# Patient Record
Sex: Male | Born: 1970 | Race: Black or African American | Hispanic: No | Marital: Married | Smoking: Current some day smoker
Health system: Southern US, Community
[De-identification: ages and names within clinical notes are randomized; demographics above are authoritative.]

## PROBLEM LIST (undated history)

## (undated) DIAGNOSIS — G4733 Obstructive sleep apnea (adult) (pediatric): Secondary | ICD-10-CM

## (undated) DIAGNOSIS — E119 Type 2 diabetes mellitus without complications: Secondary | ICD-10-CM

## (undated) DIAGNOSIS — Z720 Tobacco use: Secondary | ICD-10-CM

## (undated) DIAGNOSIS — E785 Hyperlipidemia, unspecified: Secondary | ICD-10-CM

## (undated) DIAGNOSIS — I2510 Atherosclerotic heart disease of native coronary artery without angina pectoris: Secondary | ICD-10-CM

## (undated) DIAGNOSIS — I10 Essential (primary) hypertension: Secondary | ICD-10-CM

## (undated) HISTORY — PX: ANKLE SURGERY: SHX546

## (undated) HISTORY — DX: Atherosclerotic heart disease of native coronary artery without angina pectoris: I25.10

## (undated) HISTORY — DX: Hyperlipidemia, unspecified: E78.5

## (undated) HISTORY — DX: Tobacco use: Z72.0

## (undated) HISTORY — DX: Obstructive sleep apnea (adult) (pediatric): G47.33

---

## 2003-08-01 ENCOUNTER — Inpatient Hospital Stay (HOSPITAL_COMMUNITY): Admission: EM | Admit: 2003-08-01 | Discharge: 2003-08-02 | Payer: Self-pay | Admitting: Emergency Medicine

## 2004-03-10 ENCOUNTER — Emergency Department (HOSPITAL_COMMUNITY): Admission: EM | Admit: 2004-03-10 | Discharge: 2004-03-10 | Payer: Self-pay | Admitting: Emergency Medicine

## 2004-11-17 ENCOUNTER — Emergency Department (HOSPITAL_COMMUNITY): Admission: EM | Admit: 2004-11-17 | Discharge: 2004-11-17 | Payer: Self-pay | Admitting: *Deleted

## 2004-11-18 ENCOUNTER — Emergency Department (HOSPITAL_COMMUNITY): Admission: EM | Admit: 2004-11-18 | Discharge: 2004-11-18 | Payer: Self-pay | Admitting: Emergency Medicine

## 2006-02-12 ENCOUNTER — Emergency Department (HOSPITAL_COMMUNITY): Admission: EM | Admit: 2006-02-12 | Discharge: 2006-02-12 | Payer: Self-pay | Admitting: Family Medicine

## 2006-02-20 ENCOUNTER — Ambulatory Visit (HOSPITAL_BASED_OUTPATIENT_CLINIC_OR_DEPARTMENT_OTHER): Admission: RE | Admit: 2006-02-20 | Discharge: 2006-02-20 | Payer: Self-pay | Admitting: Orthopedic Surgery

## 2006-09-03 ENCOUNTER — Emergency Department (HOSPITAL_COMMUNITY): Admission: EM | Admit: 2006-09-03 | Discharge: 2006-09-03 | Payer: Self-pay | Admitting: Emergency Medicine

## 2010-02-25 ENCOUNTER — Emergency Department (HOSPITAL_COMMUNITY)
Admission: EM | Admit: 2010-02-25 | Discharge: 2010-02-25 | Disposition: A | Discharge status: Home / Self Care | Payer: Self-pay | Attending: Emergency Medicine | Admitting: Emergency Medicine

## 2010-05-27 NOTE — Op Note (Signed)
Jorge Green, Jorge Green              ACCOUNT NO.:  [id number]   MEDICAL RECORD NO.:  [id number]          PATIENT TYPE:  AMB   LOCATION:  DSC                          FACILITY:  [facility]   PHYSICIAN:  Leonides Grills, M.D.     DATE OF BIRTH:  1970/04/12   DATE OF PROCEDURE:  02/20/2006  DATE OF DISCHARGE:                               OPERATIVE REPORT   PREOPERATIVE DIAGNOSIS:  Left lateral malleolus fracture, closed.   POSTOPERATIVE DIAGNOSIS:  Left lateral malleolus fracture, closed.   OPERATION:  1. Open reduction internal fixation, left lateral malleolus fracture.  2. Stress x-rays left ankle.   ANESTHESIA:  General.   SURGEON:  Leonides Grills, MD.   ASSISTANT:  Evlyn Kanner, P.A.   ESTIMATED BLOOD LOSS:  Minimal.   TOURNIQUET:  None.   COMPLICATIONS:  None.   DISPOSITION:  Stable to PR.   INDICATION:  A 40 year old male who sustained the above injury.  He was  consented to the above procedure.  All risks which include infection,  nerve or vessel injury, nonunion, malunion, hardware irrigation,  hardware failure, stiffness, arthritis, possibility of intra-articular  pathology may require ankle arthroscopy with debridement were all  explained.  Questions were encouraged and answered.   OPERATION:  The patient brought to the operating room, placed in supine  position.  After adequate general endotracheal tube anesthesia was  administered as well as Ancef 1 g IV piggyback, left lower lobe left  lower extremity is prepped and draped in a sterile manner over  proximally thigh tourniquet.  With the left lower extremity prepped and  draped in a sterile manner, tourniquet was used.  A longitudinal  incision over lateral aspect of the lateral malleolus then made.  Dissection was carried down directly to bone and fracture site. Fracture  site was cleaned up of hematoma and periosteum within the fracture site.  A 4-hole one-third tubular plate was applied in the antiglide position  on the posterolateral corner of the lateral malleolus.  Two-point  reduction clamp was then used to anatomically reduce the fracture and  then two 3.5 mm fully threaded cortical set screws using 2.5-mm drill  hole respectively were placed in the proximal two holes and a lag screw  through the third hole using 3.5, 2.5 mm drill holes respectively.  This  had excellent purchase and maintenance of the correction.  Final stress  x-rays were obtained AP lateral mortis views and showed no gross motion  across the fracture site, fixation in proper position.  Anatomical alignment as well.  No widening of syndesmosis with external  rotation stress as well.  The area was copiously be normal saline.  Subcu was closed with 2-0 Vicryl.  Skin was closed with 4-0 nylon.  Sterile dressing applied.  Modified Jones dressing was applied with the  ankle in neutral dorsiflexion.  Patient stable to PR.      Leonides Grills, M.D.  Electronically Signed     PB/MEDQ  D:  02/20/2006  T:  02/20/2006  Job:  [id number]

## 2010-10-21 LAB — GC/CHLAMYDIA PROBE AMP, GENITAL: GC Probe Amp, Genital: NEGATIVE

## 2011-01-31 ENCOUNTER — Ambulatory Visit (INDEPENDENT_AMBULATORY_CARE_PROVIDER_SITE_OTHER): Payer: PRIVATE HEALTH INSURANCE

## 2011-01-31 DIAGNOSIS — Z0000 Encounter for general adult medical examination without abnormal findings: Secondary | ICD-10-CM

## 2011-01-31 DIAGNOSIS — I10 Essential (primary) hypertension: Secondary | ICD-10-CM

## 2011-01-31 DIAGNOSIS — Z79899 Other long term (current) drug therapy: Secondary | ICD-10-CM

## 2011-01-31 DIAGNOSIS — F17200 Nicotine dependence, unspecified, uncomplicated: Secondary | ICD-10-CM

## 2011-04-29 ENCOUNTER — Ambulatory Visit (INDEPENDENT_AMBULATORY_CARE_PROVIDER_SITE_OTHER): Payer: PRIVATE HEALTH INSURANCE | Admitting: Emergency Medicine

## 2011-04-29 VITALS — BP 135/88 | HR 80 | Temp 98.3°F | Resp 16 | Ht 71.0 in | Wt 235.8 lb

## 2011-04-29 DIAGNOSIS — L02821 Furuncle of head [any part, except face]: Secondary | ICD-10-CM

## 2011-04-29 DIAGNOSIS — L02828 Furuncle of other sites: Secondary | ICD-10-CM

## 2011-04-29 MED ORDER — DOXYCYCLINE HYCLATE 100 MG PO TABS: 100.0000 mg | ORAL_TABLET | Freq: Two times a day (BID) | ORAL | Status: AC

## 2011-04-29 MED ORDER — MUPIROCIN 2 % EX OINT: TOPICAL_OINTMENT | Freq: Three times a day (TID) | CUTANEOUS | Status: AC

## 2011-04-29 NOTE — Progress Notes (Signed)
  Subjective:    Patient ID: Jorge Green, male    DOB: [birth year], 41 y.o.   MRN: [id number]  HPI patient enters with a 3 to four-day history of pain and swelling left temporal area. The area was very swollen last night he tried to match out some fluid.    Review of Systems history of hypertension on medication.     Objective:   Physical Exam physical exam reveals a pustule present over the left temple. There is an approximately 3 cm area of surrounding swelling. The area was compressed and a small amount of purulent material was expressed.        Assessment & Plan:   Assessment boil left temporal suspect MRSA we'll treat with Bactroban and doxycycline and was taken to

## 2011-04-29 NOTE — Patient Instructions (Signed)
MRSA Overview MRSA stands for methicillin-resistant Staphylococcus aureus. It is a type of bacteria that is resistant to some common antibiotics. It can cause infections in the skin and many other places in the body. Staphylococcus aureus, often called "staph," is a bacteria that normally lives on the skin or in the nose. Staph on the surface of the skin or in the nose does not cause problems. However, if the staph enters the body through a cut, wound, or break in the skin, an infection can happen. Up until recently, infections with the MRSA type of staph mainly occurred in hospitals and other healthcare settings. There are now increasing problems with MRSA infections in the community as well. Infections with MRSA may be very serious or even life-threatening. Most MRSA infections are acquired in one of two ways:  Healthcare-associated MRSA (HA-MRSA)   This can be acquired by people in any healthcare setting. MRSA can be a big problem for hospitalized people, people in nursing homes, people in rehabilitation facilities, people with weakened immune systems, dialysis patients, and those who have had surgery.   Community-associated MRSA (CA-MRSA)   Community spread of MRSA is becoming more common. It is known to spread in crowded settings, in jails and prisons, and in situations where there is close skin-to-skin contact, such as during sporting events or in locker rooms. MRSA can be spread through shared items, such as children's toys, razors, towels, or sports equipment.  CAUSES  All staph, including MRSA, are normally harmless unless they enter the body through a scratch, cut, or wound, such as with surgery. All staph, including MRSA, can be spread from person-to-person by touching contaminated objects or through direct contact. SPECIAL GROUPS MRSA can present problems for special groups of people. Some of these groups include:  Breastfeeding women.   The most common problem is MRSA infection of the  breast (mastitis). There is evidence that MRSA can be passed to an infant from infected breast milk. Your caregiver may recommend that you stop breastfeeding until the mastitis is under control.   If you are breastfeeding and have a MRSA infection in a place other than the breast, you may usually continue breastfeeding while under treatment. If taking antibiotics, ask your caregiver if it is safe to continue breastfeeding while taking your prescribed medicines.   Neonates (babies from birth to 1 month old) and infants (babies from 1 month to 1 year old).   There is evidence that MRSA can be passed to a newborn at birth if the mother has MRSA on the skin, in or around the birth canal, or an infection in the uterus, cervix, or vagina. MRSA infection can have the same appearance as a normal newborn or infant rash or several other skin infections. This can make it hard to diagnose MRSA.   Immune compromised people.   If you have an immune system problem, you may have a higher chance of developing a MRSA infection.   People after any type of surgery.   Staph in general, including MRSA, is the most common cause of infections occurring at the site of recent surgery.   People on long-term steroid medicines.   These kinds of medicines can lower your resistance to infection. This can increase your chance of getting MRSA.   People who have had frequent hospitalizations, live in nursing homes or other residential care facilities, have venous or urinary catheters, or have taken multiple courses of antibiotic therapy for any reason.  DIAGNOSIS  Diagnosis of MRSA is   done by cultures of fluid samples that may come from:  Swabs taken from cuts or wounds in infected areas.   Nasal swabs.   Saliva or deep cough specimens from the lungs (sputum).   Urine.   Blood.  Many people are "colonized" with MRSA but have no signs of infection. This means that people carry the MRSA germ on their skin or in their  nose and may never develop MRSA infection.  TREATMENT  Treatment varies and is based on how serious, how deep, or how extensive the infection is. For example:  Some skin infections, such as a small boil or abscess, may be treated by draining yellowish-white fluid (pus) from the site of the infection.   Deeper or more widespread soft tissue infections are usually treated with surgery to drain pus and with antibiotic medicine given by vein or by mouth. This may be recommended even if you are pregnant.   Serious infections may require a hospital stay.  If antibiotics are given, they may be needed for several weeks. PREVENTION  Because many people are colonized with staph, including MRSA, preventing the spread of the bacteria from person-to-person is most important. The best way to prevent the spread of bacteria and other germs is through proper hand washing or by using alcohol-based hand disinfectants. The following are other ways to help prevent MRSA infection within the hospital and community settings.   Healthcare settings:   Strict hand washing or hand disinfection procedures need to be followed before and after touching every patient.   Patients infected with MRSA are placed in isolation to prevent the spread of the bacteria.   Healthcare workers need to wear disposable gowns and gloves when touching or caring for patients infected with MRSA. Visitors may also be asked to wear a gown and gloves.   Hospital surfaces need to be disinfected frequently.   Community settings:   Wash your hands frequently with soap and water for at least 15 seconds. Otherwise, use alcohol-based hand disinfectants when soap and water is not available.   Make sure people who live with you wash their hands often, too.   Do not share personal items. For example, avoid sharing razors and other personal hygiene items, towels, clothing, and athletic equipment.   Wash and dry your clothes and bedding at the  warmest temperatures recommended on the labels.   Keep wounds covered. Pus from infected sores may contain MRSA and other bacteria. Keep cuts and abrasions clean and covered with germ-free (sterile), dry bandages until they are healed.   If you have a wound that appears infected, ask your caregiver if a culture for MRSA and other bacteria should be done.   If you are breastfeeding, talk to your caregiver about MRSA. You may be asked to temporarily stop breastfeeding.  HOME CARE INSTRUCTIONS   Take your antibiotics as directed. Finish them even if you start to feel better.   Avoid close contact with those around you as much as possible. Do not use towels, razors, toothbrushes, bedding, or other items that will be used by others.   To fight the infection, follow your caregiver's instructions for wound care. Wash your hands before and after changing your bandages.   If you have an intravascular device, such as a catheter, make sure you know how to care for it.   Be sure to tell any healthcare providers that you have MRSA so they are aware of your infection.  SEEK IMMEDIATE MEDICAL CARE IF:     The infection appears to be getting worse. Signs include:   Increased warmth, redness, or tenderness around the wound site.   A red line that extends from the infection site.   A dark color in the area around the infection.   Wound drainage that is tan, yellow, or green.   A bad smell coming from the wound.   You feel sick to your stomach (nauseous) and throw up (vomit) or cannot keep medicine down.   You have a fever.   Your baby is older than 3 months with a rectal temperature of 102 F (38.9 C) or higher.   Your baby is 3 months old or younger with a rectal temperature of 100.4 F (38 C) or higher.   You have difficulty breathing.  MAKE SURE YOU:   Understand these instructions.   Will watch your condition.   Will get help right away if you are not doing well or get worse.    Document Released: 12/26/2004 Document Revised: 12/15/2010 Document Reviewed: 03/30/2010 [employer] Patient Information 2012 [employer]. 

## 2011-04-30 ENCOUNTER — Telehealth: Payer: Self-pay

## 2011-04-30 NOTE — Telephone Encounter (Signed)
Patient saw Dr. Cleta Alberts yesterday and was told he has MRSA on his face. Swelling has not gone down any and pt is expected to go back to work tomorrow. Pt thinks he may need an additional one or two days to allow swelling to go down. Please advise.

## 2011-04-30 NOTE — Telephone Encounter (Signed)
Spoke with pt advised to RTC pt is on his way

## 2011-04-30 NOTE — Telephone Encounter (Signed)
Generally, as long as the area is stable or improving, and can be covered, we do not need to excuse patients from work with MRSA infections.  If he is not experiencing any improvement, or if his symptoms are worsening, he should RTC for re-evaluation.

## 2011-05-02 LAB — WOUND CULTURE: Gram Stain: NONE SEEN

## 2012-03-05 ENCOUNTER — Other Ambulatory Visit: Payer: Self-pay | Admitting: Internal Medicine

## 2012-03-05 NOTE — Telephone Encounter (Signed)
Only acute visits listed in epic, no labs, needs OV

## 2017-04-29 ENCOUNTER — Emergency Department (HOSPITAL_COMMUNITY)
Admission: EM | Admit: 2017-04-29 | Discharge: 2017-04-30 | Disposition: A | Discharge status: Home / Self Care | Payer: BLUE CROSS/BLUE SHIELD | Attending: Emergency Medicine | Admitting: Emergency Medicine

## 2017-04-29 ENCOUNTER — Encounter (HOSPITAL_COMMUNITY): Payer: Self-pay

## 2017-04-29 ENCOUNTER — Other Ambulatory Visit: Payer: Self-pay

## 2017-04-29 ENCOUNTER — Emergency Department (HOSPITAL_COMMUNITY): Payer: BLUE CROSS/BLUE SHIELD

## 2017-04-29 DIAGNOSIS — Z79899 Other long term (current) drug therapy: Secondary | ICD-10-CM | POA: Insufficient documentation

## 2017-04-29 DIAGNOSIS — Z7982 Long term (current) use of aspirin: Secondary | ICD-10-CM | POA: Insufficient documentation

## 2017-04-29 DIAGNOSIS — F17200 Nicotine dependence, unspecified, uncomplicated: Secondary | ICD-10-CM | POA: Diagnosis not present

## 2017-04-29 DIAGNOSIS — I10 Essential (primary) hypertension: Secondary | ICD-10-CM | POA: Diagnosis not present

## 2017-04-29 DIAGNOSIS — R072 Precordial pain: Secondary | ICD-10-CM

## 2017-04-29 DIAGNOSIS — R079 Chest pain, unspecified: Secondary | ICD-10-CM

## 2017-04-29 LAB — BASIC METABOLIC PANEL
ANION GAP: 10 (ref 5–15)
BUN: 11 mg/dL (ref 6–20)
CO2: 26 mmol/L (ref 22–32)
Calcium: 9.2 mg/dL (ref 8.9–10.3)
Chloride: 100 mmol/L — ABNORMAL LOW (ref 101–111)
Creatinine, Ser: 0.94 mg/dL (ref 0.61–1.24)
GFR calc Af Amer: >60 mL/min (ref >60)
GFR calc non Af Amer: >60 mL/min (ref >60)
GLUCOSE: 326 mg/dL — AB (ref 65–99)
POTASSIUM: 3.7 mmol/L (ref 3.5–5.1)
SODIUM: 136 mmol/L (ref 135–145)

## 2017-04-29 LAB — CBC
HEMATOCRIT: 44.2 % (ref 39.0–52.0)
HEMOGLOBIN: 15.3 g/dL (ref 13.0–17.0)
MCH: 28.5 pg (ref 26.0–34.0)
MCHC: 34.6 g/dL (ref 30.0–36.0)
MCV: 82.3 fL (ref 78.0–100.0)
Platelets: 264 10*3/uL (ref 150–400)
RBC: 5.37 MIL/uL (ref 4.22–5.81)
RDW: 13.4 % (ref 11.5–15.5)
WBC: 7 10*3/uL (ref 4.0–10.5)

## 2017-04-29 LAB — I-STAT TROPONIN, ED: Troponin i, poc: 0.03 ng/mL (ref 0.00–0.08)

## 2017-04-29 MED ORDER — GI COCKTAIL ~~LOC~~
30.0000 mL | Freq: Once | ORAL | Status: AC
  Administered 2017-04-30: 30 mL via ORAL
  Filled 2017-04-29: qty 30

## 2017-04-29 MED ORDER — NITROGLYCERIN 2 % TD OINT
1.0000 [in_us] | TOPICAL_OINTMENT | Freq: Four times a day (QID) | TRANSDERMAL | Status: DC
  Administered 2017-04-30: 1 [in_us] via TOPICAL
  Filled 2017-04-29: qty 1

## 2017-04-29 MED ORDER — ASPIRIN 81 MG PO CHEW
324.0000 mg | CHEWABLE_TABLET | Freq: Once | ORAL | Status: AC
  Administered 2017-04-30: 324 mg via ORAL
  Filled 2017-04-29: qty 4

## 2017-04-29 NOTE — ED Triage Notes (Signed)
Pt reports L sided chest pain and arm pain starting yesterday, but worsening this evening. Denies nausea or vomiting. Pt is hypertensive with a hx of same. A&Ox4.

## 2017-04-29 NOTE — ED Provider Notes (Signed)
[facility]-EMERGENCY DEPT Provider Note   CSN: [id number] Arrival date & time: 04/29/17  2006     History   Chief Complaint Chief Complaint  Patient presents with  . Chest Pain    HPI Jorge Green is a 47 y.o. male.  HPI Patient presents to the emergency room for evaluation chest pain.  Patient started having symptoms yesterday.  He experienced some mild discomfort in his chest but did not think too much of it.  Today he started having more pressure in his chest.  It was associated with acid reflux type symptoms as well.  Symptoms lasted for several hours throughout the afternoon.  He did radiate to his left arm.  He denies any nausea or vomiting.  Patient does have history of hypertension.  He also smokes.  He denies any trouble with shortness of breath.  No diaphoresis.  He does not have any history of heart disease.  No known history of lung disease.  No family history of heart disease. History reviewed. No pertinent past medical history.  There are no active problems to display for this patient.   History reviewed. No pertinent surgical history.      Home Medications    Prior to Admission medications   Medication Sig Start Date End Date Taking? Authorizing Provider  amLODipine (NORVASC) 10 MG tablet Take 10 mg by mouth daily. 04/12/17  Yes [provider]  aspirin EC 81 MG tablet Take 81 mg by mouth daily.   Yes [provider]  lisinopril-hydrochlorothiazide (PRINZIDE,ZESTORETIC) 20-12.5 MG per tablet Take 1 tablet by mouth daily. NEED VISIT/LABS! 03/05/12  Yes Elsie Stain E, PA-C  Multiple Vitamin (MULTIVITAMIN WITH MINERALS) TABS tablet Take 1 tablet by mouth daily.   Yes [provider]  Potassium 99 MG TABS Take 1 tablet by mouth daily.   Yes [provider]    Family History History reviewed. No pertinent family history.  Social History Social History   Tobacco Use  . Smoking status: Current Every  Day Smoker  Substance Use Topics  . Alcohol use: Not on file  . Drug use: Not on file     Allergies   Patient has no known allergies.   Review of Systems Review of Systems  All other systems reviewed and are negative.    Physical Exam Updated Vital Signs BP (!) 170/113 (BP Location: Left Arm)   Pulse 73   Temp 98.5 F (36.9 C) (Oral)   Resp 14   SpO2 98%   Physical Exam  Constitutional: He appears well-developed and well-nourished. No distress.  HENT:  Head: Normocephalic and atraumatic.  Right Ear: External ear normal.  Left Ear: External ear normal.  Eyes: Conjunctivae are normal. Right eye exhibits no discharge. Left eye exhibits no discharge. No scleral icterus.  Neck: Neck supple. No tracheal deviation present.  Cardiovascular: Normal rate, regular rhythm and intact distal pulses.  Pulmonary/Chest: Effort normal and breath sounds normal. No stridor. No respiratory distress. He has no wheezes. He has no rales.  Abdominal: Soft. Bowel sounds are normal. He exhibits no distension. There is no tenderness. There is no rebound and no guarding.  Musculoskeletal: He exhibits no edema or tenderness.  Neurological: He is alert. He has normal strength. No cranial nerve deficit (no facial droop, extraocular movements intact, no slurred speech) or sensory deficit. He exhibits normal muscle tone. He displays no seizure activity. Coordination normal.  Skin: Skin is warm and dry. No rash noted.  Psychiatric:  He has a normal mood and affect.  Nursing note and vitals reviewed.    ED Treatments / Results  Labs (all labs ordered are listed, but only abnormal results are displayed) Labs Reviewed  BASIC METABOLIC PANEL - Abnormal; Notable for the following components:      Result Value   Chloride 100 (*)    Glucose, Bld 326 (*)    All other components within normal limits  CBC  I-STAT TROPONIN, ED  I-STAT TROPONIN, ED    EKG EKG Interpretation  Date/Time:  Sunday April 29 2017 20:14:13 EDT Ventricular Rate:  97 PR Interval:    QRS Duration: 91 QT Interval:  350 QTC Calculation: 445 R Axis:   -5 Text Interpretation:  Sinus rhythm Probable left atrial enlargement RSR' in V1 or V2, probably normal variant Borderline T wave abnormalities No old tracing to compare Confirmed by Linwood DibblesKnapp, Rihana Kiddy [phone]([id number]) on 04/29/2017 10:18:15 PM   Radiology Dg Chest 2 View  Result Date: 04/29/2017 CLINICAL DATA:  Left-sided chest pain EXAM: CHEST - 2 VIEW COMPARISON:  None. FINDINGS: The heart size and mediastinal contours are within normal limits. Both lungs are clear. The visualized skeletal structures are unremarkable. IMPRESSION: No active cardiopulmonary disease. Electronically Signed   By: Jasmine PangKim  Fujinaga M.D.   On: 04/29/2017 20:36    Procedures Procedures (including critical care time)  Medications Ordered in ED Medications  nitroGLYCERIN (NITROGLYN) 2 % ointment 1 inch (1 inch Topical Given 04/30/17 0004)  aspirin chewable tablet 324 mg (324 mg Oral Given 04/30/17 0004)  gi cocktail (Maalox,Lidocaine,Donnatal) (30 mLs Oral Given 04/30/17 0004)     Initial Impression / Assessment and Plan / ED Course  I have reviewed the triage vital signs and the nursing notes.  Pertinent labs & imaging results that were available during my care of the patient were reviewed by me and considered in my medical decision making (see chart for details).   Patient presented to the emergency room for evaluation of chest pain.  Patient does not have a history of heart disease.  He does have risk factors including hypertension and tobacco use.  Patient's heart score is 3.  Overall low risk for major adverse cardiac event.  Initial troponin and laboratory tests are reassuring.  No acute ischemic changes on EKG.  Plan on a delta troponin.  As long as that is normal I think the patient can safely follow-up with his primary care doctor for outpatient stress testing.  Dr Patria Maneampos will check on the 2nd  troponin  Final Clinical Impressions(s) / ED Diagnoses   Final diagnoses:  Chest pain, unspecified type    ED Discharge Orders    None       Linwood DibblesKnapp, Lunabella Badgett, MD 04/30/17 0009

## 2017-04-30 LAB — I-STAT TROPONIN, ED: Troponin i, poc: 0.06 ng/mL (ref 0.00–0.08)

## 2017-04-30 MED ORDER — OMEPRAZOLE 20 MG PO CPDR: 20.0000 mg | DELAYED_RELEASE_CAPSULE | Freq: Every day | ORAL | 0 refills | Status: DC

## 2017-04-30 MED ORDER — FAMOTIDINE 20 MG PO TABS
20.0000 mg | ORAL_TABLET | Freq: Once | ORAL | Status: AC
  Administered 2017-04-30: 20 mg via ORAL
  Filled 2017-04-30: qty 1

## 2017-04-30 MED ORDER — LISINOPRIL 20 MG PO TABS
20.0000 mg | ORAL_TABLET | Freq: Every day | ORAL | Status: DC
  Administered 2017-04-30: 20 mg via ORAL
  Filled 2017-04-30: qty 1

## 2017-04-30 MED ORDER — LISINOPRIL-HYDROCHLOROTHIAZIDE 20-12.5 MG PO TABS: 1.0000 | ORAL_TABLET | Freq: Every day | ORAL | Status: DC

## 2017-04-30 MED ORDER — HYDROCHLOROTHIAZIDE 12.5 MG PO CAPS
12.5000 mg | ORAL_CAPSULE | Freq: Every day | ORAL | Status: DC
  Administered 2017-04-30: 12.5 mg via ORAL
  Filled 2017-04-30: qty 1

## 2017-04-30 MED ORDER — ALUM & MAG HYDROXIDE-SIMETH 200-200-20 MG/5ML PO SUSP
30.0000 mL | Freq: Once | ORAL | Status: AC
  Administered 2017-04-30: 30 mL via ORAL
  Filled 2017-04-30: qty 30

## 2017-04-30 NOTE — ED Notes (Signed)
Pt denies N/V or SOB

## 2017-04-30 NOTE — ED Provider Notes (Signed)
   EKG Interpretation #3  Date/Time:  Monday April 30 2017 00:39:17 EDT Ventricular Rate:  83 PR Interval:    QRS Duration: 98 QT Interval:  350 QTC Calculation: 412 R Axis:   15 Text Interpretation:  Sinus rhythm RSR' in V1 or V2, probably normal variant Borderline T abnormalities, lateral leads No significant change was found Confirmed by Azalia Bilisampos, Jaaziah Schulke [phone]([id number]) on 04/30/2017 1:44:20 AM      Patient has some transient recurrence of left-sided chest discomfort but this was relieved by taking off the nitroglycerin.  He states he felt much better once the nitro paste was removed from his left chest.  I spoke with the patient in much of his symptoms sound like gastroesophageal reflux disease.  He has had poor dietary choices over the past several days.  Low suspicion for ACS in the setting of several EKGs that are unchanged without ischemic changes and 2- troponins in the emergency department.  Outpatient primary care and cardiology follow-up.  Patient understands return to the ER for new or worsening symptoms.  He will be started on Prilosec daily for 2 weeks.  I recommended dietary changes as well  I personally reviewed the patient's chest x-ray which demonstrates no acute abnormality.  Troponin negative x2.  Hemoglobin stable at 15.3.  Hyperglycemia of 326 noted   Azalia Bilisampos, Ravina Milner, MD 04/30/17 0145

## 2019-08-06 ENCOUNTER — Encounter (HOSPITAL_COMMUNITY): Payer: Self-pay

## 2019-08-06 ENCOUNTER — Emergency Department (HOSPITAL_COMMUNITY): Payer: No Typology Code available for payment source

## 2019-08-06 ENCOUNTER — Other Ambulatory Visit: Payer: Self-pay

## 2019-08-06 ENCOUNTER — Emergency Department (HOSPITAL_COMMUNITY)
Admission: EM | Admit: 2019-08-06 | Discharge: 2019-08-06 | Disposition: A | Discharge status: Home / Self Care | Payer: No Typology Code available for payment source | Attending: Emergency Medicine | Admitting: Emergency Medicine

## 2019-08-06 DIAGNOSIS — S0990XA Unspecified injury of head, initial encounter: Secondary | ICD-10-CM | POA: Diagnosis present

## 2019-08-06 DIAGNOSIS — I10 Essential (primary) hypertension: Secondary | ICD-10-CM | POA: Diagnosis not present

## 2019-08-06 DIAGNOSIS — Z7982 Long term (current) use of aspirin: Secondary | ICD-10-CM | POA: Insufficient documentation

## 2019-08-06 DIAGNOSIS — S0081XA Abrasion of other part of head, initial encounter: Secondary | ICD-10-CM | POA: Insufficient documentation

## 2019-08-06 DIAGNOSIS — F17200 Nicotine dependence, unspecified, uncomplicated: Secondary | ICD-10-CM | POA: Diagnosis not present

## 2019-08-06 DIAGNOSIS — M25562 Pain in left knee: Secondary | ICD-10-CM | POA: Insufficient documentation

## 2019-08-06 DIAGNOSIS — Y999 Unspecified external cause status: Secondary | ICD-10-CM | POA: Diagnosis not present

## 2019-08-06 DIAGNOSIS — Y929 Unspecified place or not applicable: Secondary | ICD-10-CM | POA: Insufficient documentation

## 2019-08-06 DIAGNOSIS — S40012A Contusion of left shoulder, initial encounter: Secondary | ICD-10-CM | POA: Diagnosis not present

## 2019-08-06 DIAGNOSIS — S060X0A Concussion without loss of consciousness, initial encounter: Secondary | ICD-10-CM | POA: Diagnosis not present

## 2019-08-06 DIAGNOSIS — S161XXA Strain of muscle, fascia and tendon at neck level, initial encounter: Secondary | ICD-10-CM | POA: Diagnosis not present

## 2019-08-06 DIAGNOSIS — Y939 Activity, unspecified: Secondary | ICD-10-CM | POA: Diagnosis not present

## 2019-08-06 DIAGNOSIS — E119 Type 2 diabetes mellitus without complications: Secondary | ICD-10-CM | POA: Diagnosis not present

## 2019-08-06 DIAGNOSIS — Z79899 Other long term (current) drug therapy: Secondary | ICD-10-CM | POA: Diagnosis not present

## 2019-08-06 MED ORDER — OXYCODONE-ACETAMINOPHEN 5-325 MG PO TABS
1.0000 | ORAL_TABLET | Freq: Once | ORAL | Status: AC
  Administered 2019-08-06: 1 via ORAL
  Filled 2019-08-06: qty 1

## 2019-08-06 MED ORDER — CYCLOBENZAPRINE HCL 10 MG PO TABS
10.0000 mg | ORAL_TABLET | Freq: Two times a day (BID) | ORAL | 0 refills | Status: DC | PRN
Start: 2019-08-06 — End: 2020-03-29
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
## 2019-08-06 NOTE — ED Notes (Signed)
An After Visit Summary was printed and given to the patient. Discharge instructions given and no further questions at this time.  
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
## 2020-03-29 ENCOUNTER — Encounter (HOSPITAL_COMMUNITY): Payer: Self-pay | Admitting: Emergency Medicine

## 2020-03-29 ENCOUNTER — Other Ambulatory Visit: Payer: Self-pay

## 2020-03-29 ENCOUNTER — Emergency Department (HOSPITAL_COMMUNITY)
Admission: EM | Admit: 2020-03-29 | Discharge: 2020-03-29 | Disposition: A | Discharge status: Home / Self Care | Payer: BC Managed Care – PPO | Attending: Emergency Medicine | Admitting: Emergency Medicine

## 2020-03-29 DIAGNOSIS — R030 Elevated blood-pressure reading, without diagnosis of hypertension: Secondary | ICD-10-CM

## 2020-03-29 DIAGNOSIS — F17200 Nicotine dependence, unspecified, uncomplicated: Secondary | ICD-10-CM | POA: Insufficient documentation

## 2020-03-29 DIAGNOSIS — I10 Essential (primary) hypertension: Secondary | ICD-10-CM | POA: Insufficient documentation

## 2020-03-29 DIAGNOSIS — Z7982 Long term (current) use of aspirin: Secondary | ICD-10-CM | POA: Diagnosis not present

## 2020-03-29 DIAGNOSIS — Z794 Long term (current) use of insulin: Secondary | ICD-10-CM | POA: Diagnosis not present

## 2020-03-29 DIAGNOSIS — Z79899 Other long term (current) drug therapy: Secondary | ICD-10-CM | POA: Insufficient documentation

## 2020-03-29 DIAGNOSIS — E119 Type 2 diabetes mellitus without complications: Secondary | ICD-10-CM | POA: Diagnosis not present

## 2020-03-29 DIAGNOSIS — R519 Headache, unspecified: Secondary | ICD-10-CM | POA: Diagnosis present

## 2020-03-29 DIAGNOSIS — Z7984 Long term (current) use of oral hypoglycemic drugs: Secondary | ICD-10-CM | POA: Diagnosis not present

## 2020-03-29 HISTORY — DX: Type 2 diabetes mellitus without complications: E11.9

## 2020-03-29 HISTORY — DX: Essential (primary) hypertension: I10

## 2020-03-29 LAB — BASIC METABOLIC PANEL
Anion gap: 7 (ref 5–15)
BUN: 13 mg/dL (ref 6–20)
CO2: 27 mmol/L (ref 22–32)
Calcium: 9 mg/dL (ref 8.9–10.3)
Chloride: 102 mmol/L (ref 98–111)
Creatinine, Ser: 0.97 mg/dL (ref 0.61–1.24)
GFR, Estimated: >60 mL/min (ref >60)
Glucose, Bld: 221 mg/dL — ABNORMAL HIGH (ref 70–99)
Potassium: 4.1 mmol/L (ref 3.5–5.1)
Sodium: 136 mmol/L (ref 135–145)

## 2020-03-29 LAB — CBC
HCT: 46.5 % (ref 39.0–52.0)
Hemoglobin: 15.2 g/dL (ref 13.0–17.0)
MCH: 27.4 pg (ref 26.0–34.0)
MCHC: 32.7 g/dL (ref 30.0–36.0)
MCV: 83.8 fL (ref 80.0–100.0)
Platelets: 277 10*3/uL (ref 150–400)
RBC: 5.55 MIL/uL (ref 4.22–5.81)
RDW: 12.9 % (ref 11.5–15.5)
WBC: 6.5 10*3/uL (ref 4.0–10.5)
nRBC: 0 % (ref 0.0–0.2)

## 2020-03-29 LAB — CBG MONITORING, ED: Glucose-Capillary: 306 mg/dL — ABNORMAL HIGH (ref 70–99)

## 2020-03-29 MED ORDER — AMLODIPINE BESYLATE 5 MG PO TABS
10.0000 mg | ORAL_TABLET | Freq: Once | ORAL | Status: AC
  Administered 2020-03-29: 10 mg via ORAL
  Filled 2020-03-29: qty 2

## 2020-03-29 MED ORDER — HYDROCHLOROTHIAZIDE 25 MG PO TABS: 25.0000 mg | ORAL_TABLET | Freq: Every day | ORAL | 0 refills | Status: DC

## 2020-03-29 MED ORDER — AMLODIPINE BESYLATE 10 MG PO TABS: 10.0000 mg | ORAL_TABLET | Freq: Every day | ORAL | 0 refills | Status: DC

## 2020-03-29 MED ORDER — METFORMIN HCL 500 MG PO TABS: 500.0000 mg | ORAL_TABLET | Freq: Two times a day (BID) | ORAL | 0 refills | Status: DC

## 2020-03-29 NOTE — ED Triage Notes (Signed)
Arrived via EMS from doctors office. This was the first time seeing this doctor. He did have primary doctor for a while and has a history of high blood pressure and diabetes. Suppose to take medication however has not. BP in office 211/138 with EMS 176/126. Head ache throbbing 4/10 states stressed after hearing BP value.

## 2020-03-29 NOTE — ED Provider Notes (Signed)
[facility] EMERGENCY DEPARTMENT Provider Note   CSN: [id number] Arrival date & time: 03/29/20  1154     History Chief Complaint  Patient presents with  . Hypertension  . Headache    Jorge Green is a 50 y.o. male history of hypertension diabetes.  Patient presents today sent in by his PCP for evaluation of hypertension and headache.  Patient reports that he lost his insurance last year and has not had any of his home medications since November 2021.  He reports that he recently was reenrolled with his insurance and went for his first primary care provider's appointment today.  He reports that he is feeling very well today and had no complaints.  When he checked into his primary care provider's office they informed him that his blood pressure was elevated systolic over 200.  He reports that he still felt well but then they told him he had to go to the emergency department for evaluation.  Patient reports that after they told him that he had to go to the emergency department he developed a headache this was a mild posterior aching pain constant nonradiating improved without intervention prior to arrival.  Patient reports that he occasionally gets headaches primarily whenever he does not eat enough food and this headache feel very similar to his normal headache.  Patient denies recent illness, fever/chills, vision changes, neck stiffness, difficulty speaking, chest pain/shortness of breath, cough, abdominal pain, nausea/vomiting, numbness/tingling, weakness or any additional concerns HPI     Past Medical History:  Diagnosis Date  . Diabetes mellitus without complication (HCC)   . Hypertension     There are no problems to display for this patient.   History reviewed. No pertinent surgical history.     No family history on file.  Social History   Tobacco Use  . Smoking status: Current Every Day Smoker  Substance Use Topics  . Alcohol use: Never  . Drug  use: Yes    Types: Marijuana    Home Medications Prior to Admission medications   Medication Sig Start Date End Date Taking? Authorizing Provider  aspirin EC 81 MG tablet Take 81 mg by mouth daily.   Yes [provider]  methocarbamol (ROBAXIN) 500 MG tablet Take 500 mg by mouth 3 (three) times daily as needed for muscle spasms. 08/06/18  Yes [provider]  Multiple Vitamin (MULTIVITAMIN WITH MINERALS) TABS tablet Take 1 tablet by mouth daily.   Yes [provider]  naproxen (NAPROSYN) 500 MG tablet Take 500 mg by mouth 2 (two) times daily as needed for mild pain. 08/06/18  Yes [provider]  amLODipine (NORVASC) 10 MG tablet Take 1 tablet (10 mg total) by mouth daily. 03/29/20   Harlene Salts A, PA-C  atorvastatin (LIPITOR) 20 MG tablet Take 20 mg by mouth daily. 07/10/19   [provider]  carvedilol (COREG) 25 MG tablet Take 25 mg by mouth in the morning and at bedtime. 09/19/19   [provider]  hydrochlorothiazide (HYDRODIURIL) 25 MG tablet Take 1 tablet (25 mg total) by mouth daily. 03/29/20   Harlene Salts A, PA-C  insulin glargine (LANTUS SOLOSTAR) 100 UNIT/ML Solostar Pen Inject 38 Units into the skin at bedtime.    [provider]  lisinopril-hydrochlorothiazide (PRINZIDE,ZESTORETIC) 20-12.5 MG per tablet Take 1 tablet by mouth daily. NEED VISIT/LABS! 03/05/12   Godfrey Pick, PA-C  losartan (COZAAR) 100 MG tablet Take 100 mg by mouth daily. 08/14/19   [provider]  metFORMIN (GLUCOPHAGE) 500 MG tablet Take 1 tablet (500 mg total) by mouth 2 (two) times daily with a meal. 03/29/20   Bill SalinasMorelli, Brandon A, PA-C  Potassium 99 MG TABS Take 1 tablet by mouth daily. Patient not taking: Reported on 03/29/2020    [provider]  spironolactone (ALDACTONE) 25 MG tablet Take 25 mg by mouth daily. 08/14/19   [provider]  omeprazole (PRILOSEC) 20 MG capsule Take 1 capsule (20 mg total) by mouth  daily. Patient not taking: No sig reported 04/30/17 03/29/20  Azalia Bilisampos, Kevin, MD    Allergies    Patient has no known allergies.  Review of Systems   Review of Systems Ten systems are reviewed and are negative for acute change except as noted in the HPI  Physical Exam Updated Vital Signs BP (!) 160/99   Pulse 74   Temp 98.1 F (36.7 C)   Resp 18   Ht 6' (1.829 m)   Wt 108.9 kg   SpO2 100%   BMI 32.55 kg/m   Physical Exam Constitutional:      General: He is not in acute distress.    Appearance: Normal appearance. He is well-developed. He is not ill-appearing or diaphoretic.  HENT:     Head: Normocephalic and atraumatic.  Eyes:     General: Vision grossly intact. Gaze aligned appropriately.     Pupils: Pupils are equal, round, and reactive to light.  Neck:     Trachea: Trachea and phonation normal.  Cardiovascular:     Rate and Rhythm: Normal rate and regular rhythm.  Pulmonary:     Effort: Pulmonary effort is normal. No respiratory distress.  Abdominal:     General: There is no distension.     Palpations: Abdomen is soft.     Tenderness: There is no abdominal tenderness. There is no guarding or rebound.  Musculoskeletal:        General: Normal range of motion.     Cervical back: Normal range of motion.  Skin:    General: Skin is warm and dry.  Neurological:     Mental Status: He is alert.     GCS: GCS eye subscore is 4. GCS verbal subscore is 5. GCS motor subscore is 6.     Comments: Speech is clear and goal oriented, follows commands Major Cranial nerves without deficit, no facial droop Normal strength in upper and lower extremities bilaterally including dorsiflexion and plantar flexion, strong and equal grip strength Sensation normal to light and sharp touch Moves extremities without ataxia, coordination intact Normal finger to nose and rapid alternating movements Neg romberg, no pronator drift Normal gait Normal heel-shin and balance  Psychiatric:         Behavior: Behavior normal.    ED Results / Procedures / Treatments   Labs (all labs ordered are listed, but only abnormal results are displayed) Labs Reviewed  BASIC METABOLIC PANEL - Abnormal; Notable for the following components:      Result Value   Glucose, Bld 221 (*)    All other components within normal limits  CBG MONITORING, ED - Abnormal; Notable for the following components:   Glucose-Capillary 306 (*)    All other components within normal limits  CBC  URINALYSIS, ROUTINE W REFLEX MICROSCOPIC  CBG MONITORING, ED    EKG None  Radiology No results found.  Procedures Procedures   Medications Ordered in ED Medications  amLODipine (NORVASC) tablet 10 mg (10 mg Oral Given 03/29/20 1342)  ED Course  I have reviewed the triage vital signs and the nursing notes.  Pertinent labs & imaging results that were available during my care of the patient were reviewed by me and considered in my medical decision making (see chart for details).    MDM Rules/Calculators/A&P                         Additional history obtained from: 1. Nursing notes from this visit. 2. Electronic medical records.  Unable to review patient's PCP visit from today. --------------------- 50 year old male presented sent by PCP today for concern of hypertension.  Recently out of all his medications including blood pressure medicine.  He was feeling well today and this was a normal follow-up appointment for him.  He did not develop a headache until after he was told he had to go to the emergency department by his PCP.  His headache is completely resolved prior to arrival and his blood pressure is improved as well.  I am unable to review patient's primary care provider visit from today.  On evaluation patient is well-appearing no acute distress reports that he is feeling well has no complaints.  Normal neurologic exam.  Blood work was ordered in triage.  CBC is within normal limits, no leukocytosis to suggest  infectious process, no anemia or thrombocytopenia. BMP shows hyperglycemia of 221, no emergent electrolyte derangement, AKI or gap.  CBG of 306, no evidence for DKA as patient has normal bicarb and normal gap.  Additionally patient is not insulin-dependent.  There is no indication for further work-up at this time and indication for imaging.  Patient's headache has resolved and he reports that his normal headache for him there are no red flag symptoms at this time to suggest CVA, meningitis, hypertensive emergency or other emergent pathologies.  Will have medication reconciliation performed and refill patient's home meds. - Med reconciliation performed however it appears many of these medications are redundant and I am not sure that his medications are completely correct.  Unfortunately I cannot review patient's recent PCP visit to confirm.  Plan will be to refill patient's amlodipine, HCTZ and Metformin as he reports is very sure these are the medications he is normally on.  I have asked the patient to call his primary care provider tomorrow morning to schedule a follow-up appointment for blood pressure recheck and medication management.  Patient is agreeable to plan of care he has no complaints or concerns.  On reassessment he is resting comfortably in bed no acute distress denies any pain, no neurologic complaint appears stable for discharge.   At this time there does not appear to be any evidence of an acute emergency medical condition and the patient appears stable for discharge with appropriate outpatient follow up. Diagnosis was discussed with patient who verbalizes understanding of care plan and is agreeable to discharge. I have discussed return precautions with patient who verbalizes understanding. Patient encouraged to follow-up with their PCP. All questions answered.  Patient's case discussed with Dr. Rosalia Hammers who agrees with plan to discharge with follow-up.   Note: Portions of this report may have  been transcribed using voice recognition software. Every effort was made to ensure accuracy; however, inadvertent computerized transcription errors may still be present. Final Clinical Impression(s) / ED Diagnoses Final diagnoses:  Elevated blood pressure reading    Rx / DC Orders ED Discharge Orders         Ordered    amLODipine (NORVASC) 10  MG tablet  Daily        03/29/20 1630    hydrochlorothiazide (HYDRODIURIL) 25 MG tablet  Daily        03/29/20 1630    metFORMIN (GLUCOPHAGE) 500 MG tablet  2 times daily with meals        03/29/20 1630           Elizabeth Palau 03/29/20 1640    Margarita Grizzle, MD 03/30/20 [phone]

## 2020-03-29 NOTE — Discharge Instructions (Signed)
At this time there does not appear to be the presence of an emergent medical condition, however there is always the potential for conditions to change. Please read and follow the below instructions.  Please return to the Emergency Department immediately for any new or worsening symptoms. Please be sure to follow up with your Primary Care Provider within one week regarding your visit today; please call their office to schedule an appointment even if you are feeling better for a follow-up visit. 2 of your blood pressure medications the hydrochlorothiazide and amlodipine were refilled today along with your Metformin which helps with your high blood sugar levels.  Please call your primary care provider's office tomorrow morning to schedule a follow-up appointment and for refill of the remainder of your medications.  Many of your daily medications were redundant so we did not refill all of them today this will need to be reconciled by your primary care provider soon.  Go to the nearest Emergency Department immediately if: You have fever or chills Get a very bad headache. Start to feel mixed up (confused). Feel weak or numb. Feel faint. Have very bad pain in your: Chest. Belly (abdomen). Throw up more than once. Have trouble breathing. You have any new/concerning or worsening of symptoms   Please read the additional information packets attached to your discharge summary.  Do not take your medicine if  develop an itchy rash, swelling in your mouth or lips, or difficulty breathing; call 911 and seek immediate emergency medical attention if this occurs.  You may review your lab tests and imaging results in their entirety on your MyChart account.  Please discuss all results of fully with your primary care provider and other specialist at your follow-up visit.  Note: Portions of this text may have been transcribed using voice recognition software. Every effort was made to ensure accuracy; however,  inadvertent computerized transcription errors may still be present.

## 2020-05-10 NOTE — Progress Notes (Signed)
Cardiology Office Note:   Date:  05/11/2020  NAME:  Jorge Green    MRN: [id number] DOB:  Mar 10, 1970   PCP:  Patient, No Pcp Per (Inactive)  Cardiologist:  None  Electrophysiologist:  None   Referring MD: Clementeen Graham, PA-C   Chief Complaint  Patient presents with  . Abnormal ECG   History of Present Illness:   Jorge Green is a 50 y.o. male with a hx of HTN, DM, HLD who is being seen today for the evaluation of abnormal EKG at the request of Scifres, Nicole Cella, [place]. Evaluated for HTN by [facility] last year.  He reports he has had high blood pressure for several years.  He is also diabetic.  He diabetes is poorly controlled.  Lipid profile below.  BP in office 150/97.  Current medications include nifedipine 90 mg daily, losartan 100 mg daily, HCTZ 25 mg daily, Coreg 25 mg twice daily.  He is a current everyday smoker.  Smokes 1 pack a day for nearly 34 years.  He apparently consumes a lot of high salty meals.  He eats fast food often.  We discussed that this is likely a big source of his high blood pressure.  He is also obese with a BMI of 34.  He snores and has never undergone a sleep study.  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  Denies any limitations with completing a full day's work.  Reports no chest pain or shortness of breath.  He reports his diet needs to be improved.  No structured exercise but able to complete a full day of work.  Recent TSH in September was 0.85.  It appears all of his hypertension has been thought to be related to diet as well as obesity and inactivity.  I likely agree with this.  He has no evidence of bruits in the abdomen suggest renal artery stenosis.  His diabetes needs to be improved.  His EKG in office demonstrates sinus rhythm with nonspecific ST-T changes which are likely related to blood pressure.  His mother had hypertension diabetes and strokes.  He is married with 8 children.  He reports there are 4 children still in house.  He denies any  symptoms in office today.  He informs me he does not miss doses of his medications.  He reports compliance.  He reports he does snore and is frequently fatigued.  He can fall asleep easily.  He has never undergone a sleep study.  Problem List 1. HTN 2. DM -A1c 9.0 3. HLD -T chol 126, HDL 29, LDL 71, TG 146 4. Tobacco abuse -34 pack years   Past Medical History: Past Medical History:  Diagnosis Date  . Diabetes mellitus without complication (HCC)   . Hypertension     Past Surgical History: Past Surgical History:  Procedure Laterality Date  . ANKLE SURGERY      Current Medications: Current Meds  Medication Sig  . aspirin EC 81 MG tablet Take 81 mg by mouth daily.  Marland Kitchen atorvastatin (LIPITOR) 20 MG tablet Take 20 mg by mouth daily.  . carvedilol (COREG) 25 MG tablet Take 25 mg by mouth in the morning and at bedtime.  . hydrochlorothiazide (HYDRODIURIL) 25 MG tablet Take 1 tablet (25 mg total) by mouth daily.  . insulin glargine (LANTUS SOLOSTAR) 100 UNIT/ML Solostar Pen Inject 38 Units into the skin at bedtime.  Marland Kitchen losartan (COZAAR) 100 MG tablet Take 100 mg by mouth daily.  . metFORMIN (GLUCOPHAGE) 500  MG tablet Take 1 tablet (500 mg total) by mouth 2 (two) times daily with a meal.  . methocarbamol (ROBAXIN) 500 MG tablet Take 500 mg by mouth 3 (three) times daily as needed for muscle spasms.  . Multiple Vitamin (MULTIVITAMIN WITH MINERALS) TABS tablet Take 1 tablet by mouth daily.  . naproxen (NAPROSYN) 500 MG tablet Take 500 mg by mouth 2 (two) times daily as needed for mild pain.  Marland Kitchen NIFEdipine (ADALAT CC) 90 MG 24 hr tablet Take 90 mg by mouth daily.  . Potassium 99 MG TABS Take 1 tablet by mouth daily.  Marland Kitchen spironolactone (ALDACTONE) 25 MG tablet Take 25 mg by mouth daily.     Allergies:    Patient has no known allergies.   Social History: Social History   Socioeconomic History  . Marital status: Married    Spouse name: Not on file  . Number of children: 8  . Years of  education: Not on file  . Highest education level: Not on file  Occupational History  . Occupation: Floor Tech  Tobacco Use  . Smoking status: Current Every Day Smoker    Packs/day: 1.00    Years: 34.00    Pack years: 34.00    Types: Cigarettes  . Smokeless tobacco: Never Used  Substance and Sexual Activity  . Alcohol use: Never  . Drug use: Yes    Types: Marijuana  . Sexual activity: Yes    Partners: Female  Other Topics Concern  . Not on file  Social History Narrative  . Not on file   Social Determinants of Health   Financial Resource Strain: Not on file  Food Insecurity: Not on file  Transportation Needs: Not on file  Physical Activity: Not on file  Stress: Not on file  Social Connections: Not on file     Family History: The patient's family history includes Diabetes in his mother; Hypertension in his mother; Stroke in his mother.  ROS:   All other ROS reviewed and negative. Pertinent positives noted in the HPI.     EKGs/Labs/Other Studies Reviewed:   The following studies were personally reviewed by me today:  EKG:  EKG is ordered today.  The ekg ordered today demonstrates normal sinus rhythm heart rate 95, nonspecific ST-T changes, no evidence of prior infarction, and was personally reviewed by me.   TTE 03/06/2019 [facility] Left Ventricle: Normal left ventricle size. There is mild concentric  hypertrophy. Systolic function is normal. LV EF: 60-65%. Wall motion is  normal. No apical thrombus Doppler parameters indicate normal diastolic  function.  . Right Ventricle: Right ventricle appears normal. Normal tricuspid  annular plane systolic excursion (TAPSE) >1.7 cm.  . Aortic Valve: The leaflets exhibit normal excursion. There is mild  sclerosis. There is no regurgitation or stenosis.  . Left Atrium: Left atrium cavity is mildly dilated.   Recent Labs: 03/29/2020: BUN 13; Creatinine, Ser 0.97; Hemoglobin 15.2; Platelets 277; Potassium 4.1; Sodium 136    Recent Lipid Panel No results found for: CHOL, TRIG, HDL, CHOLHDL, VLDL, LDLCALC, LDLDIRECT  Physical Exam:   VS:  BP (!) 150/92   Pulse 95   Ht 5\' 11"  (1.803 m)   Wt 241 lb 3.2 oz (109.4 kg)   SpO2 97%   BMI 33.64 kg/m    Wt Readings from Last 3 Encounters:  05/11/20 241 lb 3.2 oz (109.4 kg)  03/29/20 240 lb (108.9 kg)  04/29/11 235 lb 12.8 oz (107 kg)    General: Well nourished, well  developed, in no acute distress Head: Atraumatic, normal size  Eyes: PEERLA, EOMI  Neck: Supple, no JVD Endocrine: No thryomegaly Cardiac: Normal S1, S2; RRR; no murmurs, rubs, or gallops Lungs: Clear to auscultation bilaterally, no wheezing, rhonchi or rales  Abd: Soft, nontender, no hepatomegaly  Ext: No edema, pulses 2+ Musculoskeletal: No deformities, BUE and BLE strength normal and equal Skin: Warm and dry, no rashes   Neuro: Alert and oriented to person, place, time, and situation, CNII-XII grossly intact, no focal deficits  Psych: Normal mood and affect   ASSESSMENT:   PERNELL LENOIR is a 50 y.o. male who presents for the following: 1. Nonspecific abnormal electrocardiogram (ECG) (EKG)   2. Primary hypertension   3. Tobacco abuse   4. Mixed hyperlipidemia   5. Obesity (BMI 30-39.9)   6. Snoring   7. Chronic fatigue     PLAN:   1. Nonspecific abnormal electrocardiogram (ECG) (EKG) 2. Primary hypertension -EKG demonstrates normal sinus rhythm with changes of hypertension.  Echocardiogram through the [facility] system in 2021 showed normal LV function.  His blood pressure is not controlled 150/97 despite 4 medications including a diuretic and MRA.  He is on nifedipine 90 mg daily, Coreg 25 twice a day, Aldactone 25 mg daily, losartan 100 mg daily.  He informed me he no longer takes HCTZ. -Recent thyroid study is normal.  Main risk factors for hypertension include family history, obesity, poor diet and activity.  He also continues to smoke.  This is not helping. -He reports he does  snore and is easily fatigued.  He can fall asleep quite easily.  I suspect he may have sleep apnea which could be contributing.  I recommended a home sleep study. -I think diet is a big issue here.  He does eat fast food and salty meals all the time.  He is given information about salt reductive strategies.  He really needs to work on this. -He is also very and active.  Was counseled on appropriate exercise regimen.  Needs to start structured aerobic activity. -I really see no need to screen him for renal artery stenosis.  He has no significant bruits.  No symptoms concerning for dissection/pulmonary edema.  I would like to start with dietary modification as well as regular exercise.  We also need screening for sleep apnea. -He will start to keep a blood pressure log daily.  I suspect if he does improve his diet and exercises more he will see his blood pressure go down.  Smoking cessation will also help. -I would like to see him back in 3 months for reevaluation.  We likely will get him over to see Dr. Chilton Si if she has more resources in her hypertension clinic if I cannot get him to goal.  3. Tobacco abuse -34 pack years.  Still smoking 1 pack/day.  Smoking cessation advised.  4. Mixed hyperlipidemia -Most recent LDL cholesterol 71.  He is diabetic.  Close to goal.  Continue Lipitor.  5. Obesity (BMI 30-39.9) 6. Snoring 7. Chronic fatigue -Home sleep study recommended.  Disposition: Return in about 3 months (around 08/11/2020).  Medication Adjustments/Labs and Tests Ordered: Current medicines are reviewed at length with the patient today.  Concerns regarding medicines are outlined above.  Orders Placed This Encounter  Procedures  . EKG 12-Lead  . Home sleep test   No orders of the defined types were placed in this encounter.   Patient Instructions  Medication Instructions:  The current medical regimen is  effective;  continue present plan and medications.  *If you need a  refill on your cardiac medications before your next appointment, please call your pharmacy*  Testing/Procedures: Your physician has recommended that you have a sleep study. This test records several body functions during sleep, including: brain activity, eye movement, oxygen and carbon dioxide blood levels, heart rate and rhythm, breathing rate and rhythm, the flow of air through your mouth and nose, snoring, body muscle movements, and chest and belly movement.   Follow-Up: At [facility], you and your health needs are our priority.  As part of our continuing mission to provide you with exceptional heart care, we have created designated Provider Care Teams.  These Care Teams include your primary Cardiologist (physician) and Advanced Practice Providers (APPs -  Physician Assistants and Nurse Practitioners) who all work together to provide you with the care you need, when you need it.  We recommend signing up for the patient portal called "MyChart".  Sign up information is provided on this After Visit Summary.  MyChart is used to connect with patients for Virtual Visits (Telemedicine).  Patients are able to view lab/test results, encounter notes, upcoming appointments, etc.  Non-urgent messages can be sent to your provider as well.   To learn more about what you can do with MyChart, go to [email or web address].    Your next appointment:   3 month(s)  The format for your next appointment:   In Person  Provider:   Lennie OdorWesley O'Neal, MD       Signed, Lenna GilfordWesley T. Flora Lipps'Neal, MD, [facility] [place]  [facility]  [address], [address] [place], [place] [address] [phone]  05/11/2020 11:27 AM

## 2020-05-11 ENCOUNTER — Other Ambulatory Visit: Payer: Self-pay

## 2020-05-11 ENCOUNTER — Ambulatory Visit (INDEPENDENT_AMBULATORY_CARE_PROVIDER_SITE_OTHER): Payer: BC Managed Care – PPO | Admitting: Cardiovascular Disease

## 2020-05-11 ENCOUNTER — Encounter: Payer: Self-pay | Admitting: Cardiovascular Disease

## 2020-05-11 VITALS — BP 150/92 | HR 95 | Ht 71.0 in | Wt 241.2 lb

## 2020-05-11 DIAGNOSIS — E782 Mixed hyperlipidemia: Secondary | ICD-10-CM | POA: Diagnosis not present

## 2020-05-11 DIAGNOSIS — R9431 Abnormal electrocardiogram [ECG] [EKG]: Secondary | ICD-10-CM

## 2020-05-11 DIAGNOSIS — R5382 Chronic fatigue, unspecified: Secondary | ICD-10-CM

## 2020-05-11 DIAGNOSIS — I10 Essential (primary) hypertension: Secondary | ICD-10-CM | POA: Diagnosis not present

## 2020-05-11 DIAGNOSIS — Z720 Tobacco use: Secondary | ICD-10-CM | POA: Diagnosis not present

## 2020-05-11 DIAGNOSIS — E669 Obesity, unspecified: Secondary | ICD-10-CM

## 2020-05-11 DIAGNOSIS — R0683 Snoring: Secondary | ICD-10-CM

## 2020-05-11 NOTE — Patient Instructions (Signed)
Medication Instructions:  The current medical regimen is effective;  continue present plan and medications.  *If you need a refill on your cardiac medications before your next appointment, please call your pharmacy*  Testing/Procedures: Your physician has recommended that you have a sleep study. This test records several body functions during sleep, including: brain activity, eye movement, oxygen and carbon dioxide blood levels, heart rate and rhythm, breathing rate and rhythm, the flow of air through your mouth and nose, snoring, body muscle movements, and chest and belly movement.   Follow-Up: At [facility], you and your health needs are our priority.  As part of our continuing mission to provide you with exceptional heart care, we have created designated Provider Care Teams.  These Care Teams include your primary Cardiologist (physician) and Advanced Practice Providers (APPs -  Physician Assistants and Nurse Practitioners) who all work together to provide you with the care you need, when you need it.  We recommend signing up for the patient portal called "MyChart".  Sign up information is provided on this After Visit Summary.  MyChart is used to connect with patients for Virtual Visits (Telemedicine).  Patients are able to view lab/test results, encounter notes, upcoming appointments, etc.  Non-urgent messages can be sent to your provider as well.   To learn more about what you can do with MyChart, go to [email or web address].    Your next appointment:   3 month(s)  The format for your next appointment:   In Person  Provider:   Lennie Odor, MD

## 2020-08-22 NOTE — Progress Notes (Signed)
Cardiology Office Note:   Date:  08/23/2020  NAME:  Jorge Green    MRN: [id number] DOB:  Jul 26, 1970   PCP:  Clementeen Graham, PA-C  Cardiologist:  None  Electrophysiologist:  None   Referring MD: No ref. provider found   Chief Complaint  Patient presents with   Follow-up    History of Present Illness:   Jorge Green is a 50 y.o. male with a hx of diabetes, hypertension, hyperlipidemia, tobacco abuse who presents for follow-up.  He was seen in May 2022 for abnormal EKG.  He was on 4 blood pressure medications including Coreg, nifedipine, losartan and Aldactone.  There were concerns for sleep apnea.  Home sleep study was ordered.  We recommended low-sodium diet as well as blood pressure log.  Smoking cessation was also recommended.  He presents for follow-up today. He reports he did not take his medications this AM. BP 148/100. Still smoking up to 1 ppd. No CP or SOB. No regular exercise reported. Salty food is reported as well. DM has improved. LDL at goal. He has not been keep tracking of his BP.  Unclear what his blood pressure is running at home.  We did order a sleep study.  He has not completed that yet.  We will need to reach out to [facility] to figure out what is going on.  Overall denies any symptoms in office.  BP still not at goal.  Problem List 1. HTN 2. DM -A1c 8.6 3. HLD -T chol 120 HDL 26 LDL 70 TG 135 4. Tobacco abuse -34 pack years   Past Medical History: Past Medical History:  Diagnosis Date   Diabetes mellitus without complication (HCC)    Hypertension     Past Surgical History: Past Surgical History:  Procedure Laterality Date   ANKLE SURGERY      Current Medications: Current Meds  Medication Sig   aspirin EC 81 MG tablet Take 81 mg by mouth daily.   atorvastatin (LIPITOR) 20 MG tablet Take 20 mg by mouth daily.   hydrochlorothiazide (HYDRODIURIL) 25 MG tablet Take 1 tablet (25 mg total) by mouth daily.   insulin glargine (LANTUS SOLOSTAR) 100  UNIT/ML Solostar Pen Inject 38 Units into the skin at bedtime.   losartan (COZAAR) 100 MG tablet Take 100 mg by mouth daily.   metFORMIN (GLUCOPHAGE) 500 MG tablet Take 1 tablet (500 mg total) by mouth 2 (two) times daily with a meal.   methocarbamol (ROBAXIN) 500 MG tablet Take 500 mg by mouth 3 (three) times daily as needed for muscle spasms.   NIFEdipine (ADALAT CC) 90 MG 24 hr tablet Take 90 mg by mouth daily.   spironolactone (ALDACTONE) 25 MG tablet Take 25 mg by mouth daily.     Allergies:    Patient has no known allergies.   Social History: Social History   Socioeconomic History   Marital status: Married    Spouse name: Not on file   Number of children: 8   Years of education: Not on file   Highest education level: Not on file  Occupational History   Occupation: Floor Tech  Tobacco Use   Smoking status: Every Day    Packs/day: 1.00    Years: 34.00    Pack years: 34.00    Types: Cigarettes   Smokeless tobacco: Never  Substance and Sexual Activity   Alcohol use: Never   Drug use: Yes    Types: Marijuana   Sexual activity: Yes    Partners:  Female  Other Topics Concern   Not on file  Social History Narrative   Not on file   Social Determinants of Health   Financial Resource Strain: Not on file  Food Insecurity: Not on file  Transportation Needs: Not on file  Physical Activity: Not on file  Stress: Not on file  Social Connections: Not on file     Family History: The patient's family history includes Diabetes in his mother; Hypertension in his mother; Stroke in his mother.  ROS:   All other ROS reviewed and negative. Pertinent positives noted in the HPI.     EKGs/Labs/Other Studies Reviewed:   The following studies were personally reviewed by me today:   TTE 03/06/2019 TTE 03/06/2019 [facility] Left Ventricle: Normal left ventricle size. There is mild concentric  hypertrophy. Systolic function is normal. LV EF: 60-65%. Wall motion is  normal. No apical  thrombus Doppler parameters indicate normal diastolic  function.    Right Ventricle: Right ventricle appears normal. Normal tricuspid  annular plane systolic excursion (TAPSE) >1.7 cm.    Aortic Valve: The leaflets exhibit normal excursion. There is mild  sclerosis. There is no regurgitation or stenosis.    Left Atrium: Left atrium cavity is mildly dilated.  Recent Labs: 03/29/2020: BUN 13; Creatinine, Ser 0.97; Hemoglobin 15.2; Platelets 277; Potassium 4.1; Sodium 136   Recent Lipid Panel No results found for: CHOL, TRIG, HDL, CHOLHDL, VLDL, LDLCALC, LDLDIRECT  Physical Exam:   VS:  BP (!) 148/100   Pulse 90   Ht 5\' 11"  (1.803 m)   Wt 233 lb 6.4 oz (105.9 kg)   SpO2 99%   BMI 32.55 kg/m    Wt Readings from Last 3 Encounters:  08/23/20 233 lb 6.4 oz (105.9 kg)  05/11/20 241 lb 3.2 oz (109.4 kg)  03/29/20 240 lb (108.9 kg)    General: Well nourished, well developed, in no acute distress Head: Atraumatic, normal size  Eyes: PEERLA, EOMI  Neck: Supple, no JVD Endocrine: No thryomegaly Cardiac: Normal S1, S2; RRR; no murmurs, rubs, or gallops Lungs: Clear to auscultation bilaterally, no wheezing, rhonchi or rales  Abd: Soft, nontender, no hepatomegaly  Ext: No edema, pulses 2+ Musculoskeletal: No deformities, BUE and BLE strength normal and equal Skin: Warm and dry, no rashes   Neuro: Alert and oriented to person, place, time, and situation, CNII-XII grossly intact, no focal deficits  Psych: Normal mood and affect   ASSESSMENT:   Jorge Green is a 50 y.o. male who presents for the following: 1. Primary hypertension   2. Mixed hyperlipidemia   3. Tobacco abuse   4. Snoring   5. Obesity (BMI 30-39.9)     PLAN:   1. Primary hypertension -Blood pressure 148/100 today.  He did not take his medications today.  He has not been keeping track of his blood pressure either.  Unclear what his BP is running at home.  He reports it was elevated when he saw his primary care  physician.  Echocardiogram in 2021 with normal LV function.  He is still smoking.  He reports he is still consuming high salty meals.  No regular exercise.  I would like for him to continue to try to quit smoking.  He should also avoid salt as much as possible.  I want him to start keeping a strict log of his blood pressure every day.  We need to know what his blood pressure is running.  He will do this.  We will also reach  back out to sleep medicine to see if he can complete his home sleep study.  I think sleep apnea could be contributing.  We will see him back in 4 months to discuss further.  For now he will continue HCTZ 25 mg daily, losartan 100 mg daily, nifedipine 90 mg daily, Aldactone 25 mg daily.  I really think it is difficult to make a medication change but he did not take his medications today and BP is 140/100.  2. Mixed hyperlipidemia -LDL cholesterol at goal.  Continue Lipitor 20 mg daily.  Goal is 70 or less given his diabetes.  3. Tobacco abuse -Smoking cessation counseling provided  4. Snoring 5. Obesity (BMI 30-39.9) -He does snore and is frequently tired.  He has uncontrolled hypertension.  Sleep apnea may be contributing  Disposition: Return in about 4 months (around 12/23/2020).  Medication Adjustments/Labs and Tests Ordered: Current medicines are reviewed at length with the patient today.  Concerns regarding medicines are outlined above.  No orders of the defined types were placed in this encounter.  No orders of the defined types were placed in this encounter.   Patient Instructions  Medication Instructions:  The current medical regimen is effective;  continue present plan and medications.  *If you need a refill on your cardiac medications before your next appointment, please call your pharmacy*   Follow-Up: At [facility], you and your health needs are our priority.  As part of our continuing mission to provide you with exceptional heart care, we have created  designated Provider Care Teams.  These Care Teams include your primary Cardiologist (physician) and Advanced Practice Providers (APPs -  Physician Assistants and Nurse Practitioners) who all work together to provide you with the care you need, when you need it.  We recommend signing up for the patient portal called "MyChart".  Sign up information is provided on this After Visit Summary.  MyChart is used to connect with patients for Virtual Visits (Telemedicine).  Patients are able to view lab/test results, encounter notes, upcoming appointments, etc.  Non-urgent messages can be sent to your provider as well.   To learn more about what you can do with MyChart, go to [email or web address].    Your next appointment:   4 month(s)  The format for your next appointment:   In Person  Provider:   Lennie Odor, MD   Other Instructions Please check your blood pressure every other day (sometimes in the morning and sometimes in the afternoon)  Please reduce salt, and stop smoking.   Time Spent with Patient: I have spent a total of 35 minutes with patient reviewing hospital notes, telemetry, EKGs, labs and examining the patient as well as establishing an assessment and plan that was discussed with the patient.  > 50% of time was spent in direct patient care.  Signed, Lenna Gilford. Flora Lipps, MD, [facility]  [facility]  [address], [address] [place], [place] [address] [phone]  08/23/2020 10:15 AM

## 2020-08-23 ENCOUNTER — Ambulatory Visit (INDEPENDENT_AMBULATORY_CARE_PROVIDER_SITE_OTHER): Payer: BC Managed Care – PPO | Admitting: Cardiovascular Disease

## 2020-08-23 ENCOUNTER — Other Ambulatory Visit: Payer: Self-pay

## 2020-08-23 ENCOUNTER — Encounter (HOSPITAL_BASED_OUTPATIENT_CLINIC_OR_DEPARTMENT_OTHER): Payer: Self-pay | Admitting: Cardiovascular Disease

## 2020-08-23 VITALS — BP 148/100 | HR 90 | Ht 71.0 in | Wt 233.4 lb

## 2020-08-23 DIAGNOSIS — I10 Essential (primary) hypertension: Secondary | ICD-10-CM

## 2020-08-23 DIAGNOSIS — R0683 Snoring: Secondary | ICD-10-CM

## 2020-08-23 DIAGNOSIS — E782 Mixed hyperlipidemia: Secondary | ICD-10-CM | POA: Diagnosis not present

## 2020-08-23 DIAGNOSIS — Z720 Tobacco use: Secondary | ICD-10-CM

## 2020-08-23 DIAGNOSIS — E669 Obesity, unspecified: Secondary | ICD-10-CM

## 2020-08-23 NOTE — Patient Instructions (Signed)
Medication Instructions:  The current medical regimen is effective;  continue present plan and medications.  *If you need a refill on your cardiac medications before your next appointment, please call your pharmacy*   Follow-Up: At [facility], you and your health needs are our priority.  As part of our continuing mission to provide you with exceptional heart care, we have created designated Provider Care Teams.  These Care Teams include your primary Cardiologist (physician) and Advanced Practice Providers (APPs -  Physician Assistants and Nurse Practitioners) who all work together to provide you with the care you need, when you need it.  We recommend signing up for the patient portal called "MyChart".  Sign up information is provided on this After Visit Summary.  MyChart is used to connect with patients for Virtual Visits (Telemedicine).  Patients are able to view lab/test results, encounter notes, upcoming appointments, etc.  Non-urgent messages can be sent to your provider as well.   To learn more about what you can do with MyChart, go to [email or web address].    Your next appointment:   4 month(s)  The format for your next appointment:   In Person  Provider:   Lennie Odor, MD   Other Instructions Please check your blood pressure every other day (sometimes in the morning and sometimes in the afternoon)  Please reduce salt, and stop smoking.

## 2020-08-31 ENCOUNTER — Telehealth: Payer: Self-pay | Admitting: *Deleted

## 2020-08-31 NOTE — Telephone Encounter (Signed)
Left HST appointment details on voicemail. 

## 2020-10-06 ENCOUNTER — Ambulatory Visit (HOSPITAL_BASED_OUTPATIENT_CLINIC_OR_DEPARTMENT_OTHER): Payer: BC Managed Care – PPO | Admitting: Cardiovascular Disease

## 2020-10-06 ENCOUNTER — Other Ambulatory Visit: Payer: Self-pay

## 2020-10-06 DIAGNOSIS — R0683 Snoring: Secondary | ICD-10-CM

## 2020-10-06 DIAGNOSIS — R5382 Chronic fatigue, unspecified: Secondary | ICD-10-CM

## 2020-10-12 ENCOUNTER — Other Ambulatory Visit: Payer: Self-pay

## 2020-10-12 ENCOUNTER — Ambulatory Visit (HOSPITAL_BASED_OUTPATIENT_CLINIC_OR_DEPARTMENT_OTHER): Payer: BC Managed Care – PPO | Admitting: Cardiovascular Disease

## 2020-10-22 ENCOUNTER — Ambulatory Visit (HOSPITAL_BASED_OUTPATIENT_CLINIC_OR_DEPARTMENT_OTHER): Payer: BC Managed Care – PPO | Attending: Cardiovascular Disease | Admitting: Cardiovascular Disease

## 2020-10-22 DIAGNOSIS — G4734 Idiopathic sleep related nonobstructive alveolar hypoventilation: Secondary | ICD-10-CM | POA: Insufficient documentation

## 2020-10-22 DIAGNOSIS — G4733 Obstructive sleep apnea (adult) (pediatric): Secondary | ICD-10-CM | POA: Insufficient documentation

## 2020-11-15 ENCOUNTER — Encounter (HOSPITAL_BASED_OUTPATIENT_CLINIC_OR_DEPARTMENT_OTHER): Payer: Self-pay | Admitting: Cardiovascular Disease

## 2020-11-15 NOTE — Procedures (Signed)
     Patient Name: Jorge Green, Jorge Green Date: 10/24/2020 Gender: Male D.O.B: 07-04-1970 Age (years): 50 Referring Provider: Ronnald Ramp ONeal Height (inches): 71 Interpreting Physician: Nicki Guadalajara MD, ABSM Weight (lbs): 234 RPSGT:  Sink BMI: 33 MRN: [id number] Neck Size: 17.00  CLINICAL INFORMATION Sleep Study Type: HST  Indication for sleep study: snoring, fatigue  Epworth Sleepiness Score: 7  SLEEP STUDY TECHNIQUE A multi-channel overnight portable sleep study was performed. The channels recorded were: nasal airflow, thoracic respiratory movement, and oxygen saturation with a pulse oximetry. Snoring was also monitored.  MEDICATIONS aspirin EC 81 MG tablet atorvastatin (LIPITOR) 20 MG tablet carvedilol (COREG) 25 MG tablet hydrochlorothiazide (HYDRODIURIL) 25 MG tablet insulin glargine (LANTUS SOLOSTAR) 100 UNIT/ML Solostar Pen lisinopril-hydrochlorothiazide (PRINZIDE,ZESTORETIC) 20-12.5 MG per tablet losartan (COZAAR) 100 MG tablet metFORMIN (GLUCOPHAGE) 500 MG tablet methocarbamol (ROBAXIN) 500 MG tablet Multiple Vitamin (MULTIVITAMIN WITH MINERALS) TABS tablet naproxen (NAPROSYN) 500 MG tablet NIFEdipine (ADALAT CC) 90 MG 24 hr tablet Potassium 99 MG TABS spironolactone (ALDACTONE) 25 MG tablet Patient self administered medications include: N/A.  SLEEP ARCHITECTURE Patient was studied for 370.8 minutes. The sleep efficiency was 100.0 % and the patient was supine for 99.3%. The arousal index was 0.0 per hour.  RESPIRATORY PARAMETERS The overall AHI was 87.7 per hour, with a central apnea index of 0 per hour.  The oxygen nadir was 82% during sleep.  CARDIAC DATA Mean heart rate during sleep was 82.5 bpm.  IMPRESSIONS - Severe obstructive sleep apnea occurred during this study (AHI  87.7/h). The severity during REM sleep cannot be assessed on this home study. - Moderate oxygen desaturation to a nadir of 82%. - Patient snored 0.1% during  the sleep.  DIAGNOSIS - Obstructive Sleep Apnea (G47.33) - Nocturnal Hypoxemia (G47.36)  RECOMMENDATIONS - Expeditious CPAP titration for further evauation and treatment of his severe sleep disordered breathing. If unable to obtain an in-lab titration, initiate Auto -PAP with EPR of 3 at 7 - 20 cm of water.  - Effort should be made to optimize nasal and oropharyngeal patency. - Positional therapy avoiding supine position during sleep. - Avoid alcohol, sedatives and other CNS depressants that may worsen sleep apnea and disrupt normal sleep architecture. - Sleep hygiene should be reviewed to assess factors that may improve sleep quality. - Weight management [facility]) and regular exercise should be initiated or continued. - Recommend a download and sleep clinic tevaluation after one month of therapy.   [Electronically signed] 11/15/2020 06:47 AM  Nicki Guadalajara MD, [employer], ABSM Diplomate, American Board of Sleep Medicine   NPI: [id number] [facility] PH: [phone]   FX: [phone] ACCREDITED BY THE AMERICAN ACADEMY OF SLEEP MEDICINE

## 2020-11-17 ENCOUNTER — Telehealth: Payer: Self-pay | Admitting: *Deleted

## 2020-11-17 ENCOUNTER — Other Ambulatory Visit: Payer: Self-pay | Admitting: Cardiovascular Disease

## 2020-11-17 DIAGNOSIS — G4733 Obstructive sleep apnea (adult) (pediatric): Secondary | ICD-10-CM

## 2020-11-17 DIAGNOSIS — G4736 Sleep related hypoventilation in conditions classified elsewhere: Secondary | ICD-10-CM

## 2020-11-17 NOTE — Telephone Encounter (Signed)
Left message to return a call to discuss HST results and recommendations. 

## 2020-11-18 NOTE — Telephone Encounter (Signed)
Left message to return a call to discuss HST results. 

## 2020-12-29 ENCOUNTER — Encounter (HOSPITAL_BASED_OUTPATIENT_CLINIC_OR_DEPARTMENT_OTHER): Payer: BC Managed Care – PPO | Admitting: Cardiovascular Disease

## 2021-01-07 ENCOUNTER — Ambulatory Visit: Payer: BC Managed Care – PPO | Admitting: Cardiovascular Disease

## 2021-02-17 ENCOUNTER — Ambulatory Visit: Payer: BC Managed Care – PPO | Admitting: Cardiovascular Disease

## 2021-03-15 ENCOUNTER — Other Ambulatory Visit: Payer: Self-pay | Admitting: *Deleted

## 2021-03-15 DIAGNOSIS — F17210 Nicotine dependence, cigarettes, uncomplicated: Secondary | ICD-10-CM

## 2021-04-05 ENCOUNTER — Encounter: Payer: Self-pay | Admitting: Acute Care

## 2021-04-05 ENCOUNTER — Ambulatory Visit (INDEPENDENT_AMBULATORY_CARE_PROVIDER_SITE_OTHER): Payer: BC Managed Care – PPO | Admitting: Acute Care

## 2021-04-05 DIAGNOSIS — F17210 Nicotine dependence, cigarettes, uncomplicated: Secondary | ICD-10-CM

## 2021-04-05 NOTE — Progress Notes (Signed)
Virtual Visit via Video Note ? ?I connected with Drue DunAnthony G Yehle on 04/05/21 at  4:00 PM EDT by a video enabled telemedicine application and verified that I am speaking with the correct person using two identifiers. ? ?Location: ?Patient:  At home ?Provider: Working from home ?  ?I discussed the limitations of evaluation and management by telemedicine and the availability of in person appointments. The patient expressed understanding and agreed to proceed. ? ? ?Shared Decision Making Visit Lung Cancer Screening Program ?([phone]) ? ? ?Eligibility: ?Age 51 y.o. ?Pack Years Smoking History Calculation 35 pack year smoking history ?(# packs/per year x # years smoked) ?Recent History of coughing up blood  no ?Unexplained weight loss? no ?( >Than 15 pounds within the last 6 months ) ?Prior History Lung / other cancer no ?(Diagnosis within the last 5 years already requiring surveillance chest CT Scans). ?Smoking Status Current Smoker ?Former Smokers: Years since quit:  NA ? Quit Date:  NA ? ?Visit Components: ?Discussion included one or more decision making aids. yes ?Discussion included risk/benefits of screening. yes ?Discussion included potential follow up diagnostic testing for abnormal scans. yes ?Discussion included meaning and risk of over diagnosis. yes ?Discussion included meaning and risk of False Positives. yes ?Discussion included meaning of total radiation exposure. yes ? ?Counseling Included: ?Importance of adherence to annual lung cancer LDCT screening. yes ?Impact of comorbidities on ability to participate in the program. yes ?Ability and willingness to under diagnostic treatment. yes ? ?Smoking Cessation Counseling: ?Current Smokers:  ?Discussed importance of smoking cessation. yes ?Information about tobacco cessation classes and interventions provided to patient. yes ?Patient provided with "ticket" for LDCT Scan.  NA ?Symptomatic Patient. no ? Counseling NA ?Diagnosis Code: Tobacco Use Z72.0 ?Asymptomatic  Patient yes ? Counseling (Intermediate counseling: > three minutes counseling) [id number] ?Former Smokers:  ?Discussed the importance of maintaining cigarette abstinence. yes ?Diagnosis Code: Personal History of Nicotine Dependence. U98.119Z87.891 ?Information about tobacco cessation classes and interventions provided to patient. Yes ?Patient provided with "ticket" for LDCT Scan. yes ?Written Order for Lung Cancer Screening with LDCT placed in Epic. Yes ?(CT Chest Lung Cancer Screening Low Dose W/O CM) [id number] ?Z12.2-Screening of respiratory organs ?Z87.891-Personal history of nicotine dependence ? ?I have spent 25 minutes of face to face/ virtual visit   time with  Mr. Hyacinth MeekerMiller discussing the risks and benefits of lung cancer screening. We viewed / discussed a power point together that explained in detail the above noted topics. We paused at intervals to allow for questions to be asked and answered to ensure understanding.We discussed that the single most powerful action that he can take to decrease his risk of developing lung cancer is to quit smoking. We discussed whether or not he is ready to commit to setting a quit date. We discussed options for tools to aid in quitting smoking including nicotine replacement therapy, non-nicotine medications, support groups, Quit Smart classes, and behavior modification. We discussed that often times setting smaller, more achievable goals, such as eliminating 1 cigarette a day for a week and then 2 cigarettes a day for a week can be helpful in slowly decreasing the number of cigarettes smoked. This allows for a sense of accomplishment as well as providing a clinical benefit. I provided  him   with smoking cessation  information  with contact information for community resources, classes, free nicotine replacement therapy, and access to mobile apps, text messaging, and on-line smoking cessation help. I have also provided  him   the office  contact information in the event he needs to contact  me, or the screening staff. We discussed the time and location of the scan, and that either Doroteo Glassman RN, Joella Prince, RN  or I will call / send a letter with the results within 24-72 hours of receiving them. The patient verbalized understanding of all of  the above and had no further questions upon leaving the office. They have my contact information in the event they have any further questions. ? ?I spent 3 minutes counseling on smoking cessation and the health risks of continued tobacco abuse. ? ?I explained to the patient that there has been a high incidence of coronary artery disease noted on these exams. I explained that this is a non-gated exam therefore degree or severity cannot be determined. This patient is on statin therapy. I have asked the patient to follow-up with their PCP regarding any incidental finding of coronary artery disease and management with diet or medication as their PCP  feels is clinically indicated. The patient verbalized understanding of the above and had no further questions upon completion of the visit. ? ?  ? ? ?Magdalen Spatz, NP ?04/05/2021 ? ? ? ? ? ? ?

## 2021-04-05 NOTE — Patient Instructions (Signed)
Thank you for participating in the [place] Lung Cancer Screening Program. °It was our pleasure to meet you today. °We will call you with the results of your scan within the next few days. °Your scan will be assigned a Lung RADS category score by the physicians reading the scans.  °This Lung RADS score determines follow up scanning.  °See below for description of categories, and follow up screening recommendations. °We will be in touch to schedule your follow up screening annually or based on recommendations of our providers. °We will fax a copy of your scan results to your Primary Care Physician, or the physician who referred you to the program, to ensure they have the results. °Please call the office if you have any questions or concerns regarding your scanning experience or results.  °Our office number is [phone]. °Please speak with Denise Phelps, RN. She is our Lung Cancer Screening RN. °If she is unavailable when you call, please have the office staff send her a message. She will return your call at her earliest convenience. °Remember, if your scan is normal, we will scan you annually as long as you continue to meet the criteria for the program. (Age 55-77, Current smoker or smoker who has quit within the last 15 years). °If you are a smoker, remember, quitting is the single most powerful action that you can take to decrease your risk of lung cancer and other pulmonary, breathing related problems. °We know quitting is hard, and we are here to help.  °Please let us know if there is anything we can do to help you meet your goal of quitting. °If you are a former smoker, congratulations. We are proud of you! Remain smoke free! °Remember you can refer friends or family members through the number above.  °We will screen them to make sure they meet criteria for the program. °Thank you for helping us take better care of you by participating in Lung Screening. ° °You can receive free nicotine replacement therapy  ( patches, gum or mints) by calling 1-800-QUIT NOW. Please call so we can get you on the path to becoming  a non-smoker. I know it is hard, but you can do this! ° °Lung RADS Categories: ° °Lung RADS 1: no nodules or definitely non-concerning nodules.  °Recommendation is for a repeat annual scan in 12 months. ° °Lung RADS 2:  nodules that are non-concerning in appearance and behavior with a very low likelihood of becoming an active cancer. °Recommendation is for a repeat annual scan in 12 months. ° °Lung RADS 3: nodules that are probably non-concerning , includes nodules with a low likelihood of becoming an active cancer.  Recommendation is for a 6-month repeat screening scan. Often noted after an upper respiratory illness. We will be in touch to make sure you have no questions, and to schedule your 6-month scan. ° °Lung RADS 4 A: nodules with concerning findings, recommendation is most often for a follow up scan in 3 months or additional testing based on our provider's assessment of the scan. We will be in touch to make sure you have no questions and to schedule the recommended 3 month follow up scan. ° °Lung RADS 4 B:  indicates findings that are concerning. We will be in touch with you to schedule additional diagnostic testing based on our provider's  assessment of the scan. ° °Hypnosis for smoking cessation  °[employer] °[phone] ° °Acupuncture for smoking cessation  °[facility] °[phone]  °

## 2021-04-06 ENCOUNTER — Ambulatory Visit (INDEPENDENT_AMBULATORY_CARE_PROVIDER_SITE_OTHER)
Admission: RE | Admit: 2021-04-06 | Discharge: 2021-04-06 | Disposition: A | Discharge status: Home / Self Care | Payer: BC Managed Care – PPO | Source: Ambulatory Visit | Attending: Acute Care | Admitting: Acute Care

## 2021-04-06 DIAGNOSIS — F17210 Nicotine dependence, cigarettes, uncomplicated: Secondary | ICD-10-CM | POA: Diagnosis not present

## 2021-04-08 ENCOUNTER — Other Ambulatory Visit: Payer: Self-pay

## 2021-04-08 DIAGNOSIS — Z87891 Personal history of nicotine dependence: Secondary | ICD-10-CM

## 2021-04-08 DIAGNOSIS — F17210 Nicotine dependence, cigarettes, uncomplicated: Secondary | ICD-10-CM

## 2021-04-11 ENCOUNTER — Ambulatory Visit: Payer: BC Managed Care – PPO | Admitting: Cardiovascular Disease

## 2021-06-06 NOTE — Progress Notes (Unsigned)
Cardiology Office Note:   Date:  06/09/2021  NAME:  Jorge Green    MRN: [id number] DOB:  [birth year]   PCP:  Jorge Green (Inactive)  Cardiologist:  None  Electrophysiologist:  None   Referring MD: Jorge Graham, PA-C   Chief Complaint  Patient presents with   Follow-up        History of Present Illness:   Jorge Green is a 51 y.o. male with a hx of HTN, DM, coronary calcifications, tobacco abuse, OSA who presents for follow-up.   Blood pressure 130/78.  Currently on Coreg 25 mg twice daily, HCTZ 25 mg daily, losartan 100 mg daily, nifedipine 90 mg daily, Aldactone 25 mg daily.  Diagnosed with severe sleep apnea.  I believe this is the culprit for his high blood pressure.  He is actually exercising more.  Tells me he is going to the gym 3 to 4 days/week.  Walking up to 2 miles on the treadmill and doing weightlifting.  He reports no chest pain or trouble breathing.  He is watching his salt intake.  He is still smoking but smoking 1/2 pack/day.  Trying to cut back.  Again denies any chest pains or trouble breathing.  Diabetes is not controlled.  Most recent A1c 8.6.  LDL cholesterol 67.  On Lipitor.  All of his numbers are at goal.  Just needs to get his sleep apnea treated and quit smoking.  Problem List 1. HTN 2. DM -A1c 8.6 3. HLD -T chol 114, HDL 26, LDL 67, TG 109 4. Tobacco abuse -34 pack years  5. OSA -Severe with hypoxemia  6. Coronary calcifications   Past Medical History: Past Medical History:  Diagnosis Date   Diabetes mellitus without complication (HCC)    Hypertension     Past Surgical History: Past Surgical History:  Procedure Laterality Date   ANKLE SURGERY      Current Medications: Current Meds  Medication Sig   aspirin EC 81 MG tablet Take 81 mg by mouth daily.   atorvastatin (LIPITOR) 20 MG tablet Take 20 mg by mouth daily.   carvedilol (COREG) 25 MG tablet Take 25 mg by mouth in the morning and at bedtime.   empagliflozin  (JARDIANCE) 10 MG TABS tablet 1 tablet   hydrochlorothiazide (HYDRODIURIL) 25 MG tablet Take 1 tablet (25 mg total) by mouth daily.   insulin glargine (LANTUS SOLOSTAR) 100 UNIT/ML Solostar Pen Inject 38 Units into the skin at bedtime.   losartan (COZAAR) 100 MG tablet Take 100 mg by mouth daily.   metFORMIN (GLUCOPHAGE) 500 MG tablet Take 1 tablet (500 mg total) by mouth 2 (two) times daily with a meal.   methocarbamol (ROBAXIN) 500 MG tablet Take 500 mg by mouth 3 (three) times daily as needed for muscle spasms.   Multiple Vitamin (MULTIVITAMIN WITH MINERALS) TABS tablet Take 1 tablet by mouth daily.   naproxen (NAPROSYN) 500 MG tablet Take 500 mg by mouth 2 (two) times daily as needed for mild pain.   NIFEdipine (ADALAT CC) 90 MG 24 hr tablet Take 90 mg by mouth daily.   Potassium 99 MG TABS Take 1 tablet by mouth daily.   spironolactone (ALDACTONE) 25 MG tablet Take 25 mg by mouth daily.     Allergies:    Patient has no known allergies.   Social History: Social History   Socioeconomic History   Marital status: Married    Spouse name: Not on file   Number of children: 8  Years of education: Not on file   Highest education level: Not on file  Occupational History   Occupation: Floor Tech  Tobacco Use   Smoking status: Every Day    Packs/day: 1.00    Years: 34.00    Pack years: 34.00    Types: Cigarettes   Smokeless tobacco: Never  Substance and Sexual Activity   Alcohol use: Never   Drug use: Yes    Types: Marijuana   Sexual activity: Yes    Partners: Female  Other Topics Concern   Not on file  Social History Narrative   Not on file   Social Determinants of Health   Financial Resource Strain: Not on file  Food Insecurity: Not on file  Transportation Needs: Not on file  Physical Activity: Not on file  Stress: Not on file  Social Connections: Not on file     Family History: The patient's family history includes Diabetes in his mother; Hypertension in his  mother; Stroke in his mother.  ROS:   All other ROS reviewed and negative. Pertinent positives noted in the HPI.     EKGs/Labs/Other Studies Reviewed:   The following studies were personally reviewed by me today:  Recent Labs: No results found for requested labs within last 8760 hours.   Recent Lipid Panel No results found for: CHOL, TRIG, HDL, CHOLHDL, VLDL, LDLCALC, LDLDIRECT  Physical Exam:   VS:  BP 130/78   Pulse 97   Ht 5\' 11"  (1.803 m)   Wt 231 lb 9.6 oz (105.1 kg)   SpO2 97%   BMI 32.30 kg/m    Wt Readings from Last 3 Encounters:  06/09/21 231 lb 9.6 oz (105.1 kg)  10/12/20 234 lb (106.1 kg)  10/06/20 234 lb (106.1 kg)    General: Well nourished, well developed, in no acute distress Head: Atraumatic, normal size  Eyes: PEERLA, EOMI  Neck: Supple, no JVD Endocrine: No thryomegaly Cardiac: Normal S1, S2; RRR; no murmurs, rubs, or gallops Lungs: Clear to auscultation bilaterally, no wheezing, rhonchi or rales  Abd: Soft, nontender, no hepatomegaly  Ext: No edema, pulses 2+ Musculoskeletal: No deformities, BUE and BLE strength normal and equal Skin: Warm and dry, no rashes   Neuro: Alert and oriented to person, place, time, and situation, CNII-XII grossly intact, no focal deficits  Psych: Normal mood and affect   ASSESSMENT:   Jorge Green is a 51 y.o. male who presents for the following: 1. Primary hypertension   2. Obstructive sleep apnea (adult) (pediatric)   3. Tobacco abuse   4. Coronary artery calcification seen on computed tomography   5. Mixed hyperlipidemia   6. Obesity (BMI 30-39.9)     PLAN:   1. Primary hypertension -Currently on Coreg 25 mg twice daily, HCTZ 25 mg daily, losartan 100 mg daily, nifedipine 90 mg daily, Aldactone 25 mg daily.  Has severe sleep apnea.  Needs to get back on his sleep machine.  I believe this is contributing.  For now we will continue current medications.  Ultimately I believe his sleep apnea is likely contributing  to his need for multiple blood pressure medications.  2. Obstructive sleep apnea (adult) (pediatric) -Severe sleep apnea.  Needs to get back into see Dr. Tresa EndoKelly.  Needs to get his machine back.  This was taken.  He has severe sleep apnea with severe hypoxemia.  3. Tobacco abuse -Still smoking 1/2 pack/day.  4 minutes of smoking cessation counseling was provided in office today.  4. Coronary artery  calcification seen on computed tomography 5. Mixed hyperlipidemia -Diabetic.  Coronary calcifications on chest CT.  No symptoms of angina.  Currently on aspirin 81 mg daily.  On Lipitor 20 mg daily.  LDL cholesterol at goal.  Encouraged him to get his diabetes under better control.  He will work with his primary care physician.  6. Obesity (BMI 30-39.9) -Weight loss recommended.  Disposition: Return in about 1 year (around 06/10/2022).  Medication Adjustments/Labs and Tests Ordered: Current medicines are reviewed at length with the patient today.  Concerns regarding medicines are outlined above.  No orders of the defined types were placed in this encounter.  No orders of the defined types were placed in this encounter.   Patient Instructions  Medication Instructions:  The current medical regimen is effective;  continue present plan and medications.  *If you need a refill on your cardiac medications before your next appointment, please call your pharmacy*   Follow-Up: At [facility], you and your health needs are our priority.  As part of our continuing mission to provide you with exceptional heart care, we have created designated Provider Care Teams.  These Care Teams include your primary Cardiologist (physician) and Advanced Practice Providers (APPs -  Physician Assistants and Nurse Practitioners) who all work together to provide you with the care you need, when you need it.  We recommend signing up for the patient portal called "MyChart".  Sign up information is provided on this After  Visit Summary.  MyChart is used to connect with patients for Virtual Visits (Telemedicine).  Patients are able to view lab/test results, encounter notes, upcoming appointments, etc.  Non-urgent messages can be sent to your provider as well.   To learn more about what you can do with MyChart, go to [email or web address].    Your next appointment:   12 month(s)  The format for your next appointment:   In Person  Provider:   Lennie Odor, MD            Time Spent with Patient: I have spent a total of 35 minutes with patient reviewing hospital notes, telemetry, EKGs, labs and examining the patient as well as establishing an assessment and plan that was discussed with the patient.  > 50% of time was spent in direct patient care.  Signed, Lenna Gilford. Flora Lipps, MD, [facility]  [facility]  [address] [place], [place] [address] [phone]  06/09/2021 9:34 AM

## 2021-06-09 ENCOUNTER — Encounter: Payer: Self-pay | Admitting: Cardiovascular Disease

## 2021-06-09 ENCOUNTER — Ambulatory Visit (INDEPENDENT_AMBULATORY_CARE_PROVIDER_SITE_OTHER): Payer: BC Managed Care – PPO | Admitting: Cardiovascular Disease

## 2021-06-09 VITALS — BP 130/78 | HR 97 | Ht 71.0 in | Wt 231.6 lb

## 2021-06-09 DIAGNOSIS — I2510 Atherosclerotic heart disease of native coronary artery without angina pectoris: Secondary | ICD-10-CM

## 2021-06-09 DIAGNOSIS — Z720 Tobacco use: Secondary | ICD-10-CM | POA: Diagnosis not present

## 2021-06-09 DIAGNOSIS — I10 Essential (primary) hypertension: Secondary | ICD-10-CM

## 2021-06-09 DIAGNOSIS — E782 Mixed hyperlipidemia: Secondary | ICD-10-CM

## 2021-06-09 DIAGNOSIS — G4733 Obstructive sleep apnea (adult) (pediatric): Secondary | ICD-10-CM | POA: Diagnosis not present

## 2021-06-09 DIAGNOSIS — E669 Obesity, unspecified: Secondary | ICD-10-CM

## 2021-06-09 NOTE — Patient Instructions (Signed)

## 2021-06-20 ENCOUNTER — Other Ambulatory Visit: Payer: Self-pay

## 2021-06-20 DIAGNOSIS — G4733 Obstructive sleep apnea (adult) (pediatric): Secondary | ICD-10-CM

## 2021-06-20 NOTE — Progress Notes (Signed)
Called patient, made aware we needed to restart sleep study- patient verbalized understanding. Advised I would order and they can get in touch with him about an appointment for sleep.   Thanks!

## 2021-09-27 ENCOUNTER — Ambulatory Visit (HOSPITAL_BASED_OUTPATIENT_CLINIC_OR_DEPARTMENT_OTHER): Payer: BC Managed Care – PPO | Attending: Cardiovascular Disease | Admitting: Cardiovascular Disease

## 2021-09-27 DIAGNOSIS — G4733 Obstructive sleep apnea (adult) (pediatric): Secondary | ICD-10-CM

## 2021-10-09 ENCOUNTER — Encounter (HOSPITAL_BASED_OUTPATIENT_CLINIC_OR_DEPARTMENT_OTHER): Payer: Self-pay | Admitting: Cardiovascular Disease

## 2021-10-09 NOTE — Procedures (Signed)
     Patient Name: Jorge Green, Jorge Green Date: 09/27/2021 Gender: Male D.O.B: 1970/03/31 Age (years): 51 Referring Provider: Cassie Freer ONeal Height (inches): 71 Interpreting Physician: Shelva Majestic MD, ABSM Weight (lbs): 231 RPSGT: Jacolyn Reedy BMI: 32 MRN: [id number] Neck Size: 17.00  CLINICAL INFORMATION Sleep Study Type: HST  Indication for sleep study: previous severe OSA not on therapy, snoring, fatigue  Epworth Sleepiness Score: 7  SLEEP STUDY TECHNIQUE A multi-channel overnight portable sleep study was performed. The channels recorded were: nasal airflow, thoracic respiratory movement, and oxygen saturation with a pulse oximetry. Snoring was also monitored.  MEDICATIONS aspirin EC 81 MG tablet atorvastatin (LIPITOR) 20 MG tablet carvedilol (COREG) 25 MG tablet empagliflozin (JARDIANCE) 10 MG TABS tablet hydrochlorothiazide (HYDRODIURIL) 25 MG tablet insulin glargine (LANTUS SOLOSTAR) 100 UNIT/ML Solostar Pen lisinopril-hydrochlorothiazide (PRINZIDE,ZESTORETIC) 20-12.5 MG per tablet losartan (COZAAR) 100 MG tablet metFORMIN (GLUCOPHAGE) 500 MG tablet methocarbamol (ROBAXIN) 500 MG tablet Multiple Vitamin (MULTIVITAMIN WITH MINERALS) TABS tablet naproxen (NAPROSYN) 500 MG tablet NIFEdipine (ADALAT CC) 90 MG 24 hr tablet Potassium 99 MG TABS spironolactone (ALDACTONE) 25 MG tablet Patient self administered medications include: N/A.  SLEEP ARCHITECTURE Patient was studied for 395.2 minutes. The sleep efficiency was 100.0 % and the patient was supine for 0%. The arousal index was 0.0 per hour.  RESPIRATORY PARAMETERS The overall AHI was 53.0 per hour, with a central apnea index of 0 per hour.  The oxygen nadir was 85% during sleep. Time spent < 89% was 5.3 minutes.  CARDIAC DATA Mean heart rate during sleep was 82.0 bpm.  IMPRESSIONS - Severe obstructive sleep apnea occurred during this study (AHI 53.0/h). - Moderate oxygen desaturation was  noted during this study (Min O2 85%). - Patient snored 51.5% during the sleep.  DIAGNOSIS - Obstructive Sleep Apnea (G47.33) - Nocturnal Hypoxemia (G47.36)  RECOMMENDATIONS - Therapeutic CPAP for treatment of his severe sleep disordered breathing. If unable to obtain an in-lab titration initiate Auto-PAP with EPR of 3 and 7 - 20 cm of water. - Effort should be made to optimize nasal and oropharyngeal patency. - Positional therapy avoiding supine position during sleep. - Avoid alcohol, sedatives and other CNS depressants that may worsen sleep apnea and disrupt normal sleep architecture. - Sleep hygiene should be reviewed to assess factors that may improve sleep quality. - Weight management [facility]) and regular exercise should be initiated or continued. - Recommend a download and sleep clinic evaluation after 4 weeks of therapy.    [Electronically signed] 10/09/2021 09:55 PM  Shelva Majestic MD, [facility], [place], American Board of Sleep Medicine  NPI: [id number]  [place] PH: [phone]   FX: [phone] [place]

## 2021-10-13 ENCOUNTER — Other Ambulatory Visit: Payer: Self-pay | Admitting: Cardiovascular Disease

## 2021-10-13 ENCOUNTER — Telehealth: Payer: Self-pay | Admitting: *Deleted

## 2021-10-13 DIAGNOSIS — G4736 Sleep related hypoventilation in conditions classified elsewhere: Secondary | ICD-10-CM

## 2021-10-13 DIAGNOSIS — G4733 Obstructive sleep apnea (adult) (pediatric): Secondary | ICD-10-CM

## 2021-10-13 NOTE — Telephone Encounter (Signed)
Patient notified of HST results and MD recommendations. He agrees to proceed with titration sleep study. He had no questions when asked.

## 2021-10-13 NOTE — Telephone Encounter (Signed)
-----   Message from Troy Sine, MD sent at 10/09/2021 10:00 PM EDT ----- Mariann Laster, please notify pt and if unable for in-lab titration then Auto-PAP as prescribed.

## 2021-11-25 ENCOUNTER — Ambulatory Visit (HOSPITAL_BASED_OUTPATIENT_CLINIC_OR_DEPARTMENT_OTHER): Payer: BC Managed Care – PPO | Attending: Cardiovascular Disease | Admitting: Cardiovascular Disease

## 2021-11-25 VITALS — Ht 71.0 in | Wt 231.0 lb

## 2021-11-25 DIAGNOSIS — I493 Ventricular premature depolarization: Secondary | ICD-10-CM | POA: Diagnosis not present

## 2021-11-25 DIAGNOSIS — G4733 Obstructive sleep apnea (adult) (pediatric): Secondary | ICD-10-CM

## 2021-11-25 DIAGNOSIS — G4736 Sleep related hypoventilation in conditions classified elsewhere: Secondary | ICD-10-CM | POA: Diagnosis not present

## 2021-12-04 ENCOUNTER — Encounter (HOSPITAL_BASED_OUTPATIENT_CLINIC_OR_DEPARTMENT_OTHER): Payer: Self-pay | Admitting: Cardiovascular Disease

## 2021-12-04 NOTE — Procedures (Signed)
Patient Name: Tyberius, Ryner Date: 11/25/2021 Gender: Male D.O.B: Dec 11, 1970 Age (years): 51 Referring Provider: Ronnald Ramp ONeal Height (inches): 71 Interpreting Physician: Nicki Guadalajara MD, ABSM Weight (lbs): 228 RPSGT: Lowry Ram BMI: 32 MRN: [id number] Neck Size: 16.50  CLINICAL INFORMATION The patient is referred for a CPAP titration to treat sleep apnea.  Date of HST: Most recent 09/27/2021: AHI 53.0/h; O2 nadir 85%                      Prior HST 10/22/2020: AHI 87.7/h; O2 nadir 82%.  SLEEP STUDY TECHNIQUE As per the AASM Manual for the Scoring of Sleep and Associated Events v2.3 (April 2016) with a hypopnea requiring 4% desaturations.  The channels recorded and monitored were frontal, central and occipital EEG, electrooculogram (EOG), submentalis EMG (chin), nasal and oral airflow, thoracic and abdominal wall motion, anterior tibialis EMG, snore microphone, electrocardiogram, and pulse oximetry. Continuous positive airway pressure (CPAP) was initiated at the beginning of the study and titrated to treat sleep-disordered breathing.  MEDICATIONS aspirin EC 81 MG tablet atorvastatin (LIPITOR) 20 MG tablet carvedilol (COREG) 25 MG tablet empagliflozin (JARDIANCE) 10 MG TABS tablet hydrochlorothiazide (HYDRODIURIL) 25 MG tablet insulin glargine (LANTUS SOLOSTAR) 100 UNIT/ML Solostar Pen lisinopril-hydrochlorothiazide (PRINZIDE,ZESTORETIC) 20-12.5 MG per tablet losartan (COZAAR) 100 MG tablet metFORMIN (GLUCOPHAGE) 500 MG tablet methocarbamol (ROBAXIN) 500 MG tablet Multiple Vitamin (MULTIVITAMIN WITH MINERALS) TABS tablet naproxen (NAPROSYN) 500 MG tablet NIFEdipine (ADALAT CC) 90 MG 24 hr tablet Potassium 99 MG TABS spironolactone (ALDACTONE) 25 MG tablet Medications self-administered by patient taken the night of the study : N/A  TECHNICIAN COMMENTS Comments added by technician: None Comments added by scorer: N/A  RESPIRATORY  PARAMETERS Optimal PAP Pressure (cm): 6 AHI at Optimal Pressure (/hr): 0 Overall Minimal O2 (%): 92.0 Supine % at Optimal Pressure (%): 22 Minimal O2 at Optimal Pressure (%): 93.0   SLEEP ARCHITECTURE The study was initiated at 10:14:25 PM and ended at 4:12:39 AM.  Sleep onset time was 5.4 minutes and the sleep efficiency was 87.4%%. The total sleep time was 313 minutes.  The patient spent 1.3%% of the night in stage N1 sleep, 71.6%% in stage N2 sleep, 0.0%% in stage N3 and 27.2% in REM.Stage REM latency was 34.5 minutes  Wake after sleep onset was 39.9. Alpha intrusion was absent. Supine sleep was 35.94%.  CARDIAC DATA The 2 lead EKG demonstrated sinus rhythm. The mean heart rate was 79.4 beats per minute. Other EKG findings include: PVCs.  LEG MOVEMENT DATA The total Periodic Limb Movements of Sleep (PLMS) were 0. The PLMS index was 0.0. A PLMS index of <15 is considered normal in adults.  IMPRESSIONS - CPAP was initiated at 5 cm and was titrated to 6 cm of water. (AHI 0, RDI O.2/h; O2 nadir 93%) - Significant oxygen desaturations were not observed during this titration (min O2 = 92.0%). - The patient snored with soft snoring volume during this titration study. - 2-lead EKG demonstrated: isolated PVCs - Clinically significant periodic limb movements were not noted during this study. Arousals associated with PLMs were rare.  DIAGNOSIS - Obstructive Sleep Apnea (G47.33)  RECOMMENDATIONS - Recommend an initial trial of CPAP Auto with EPR of 3 at 6 - 11 cm of water with heated humidification. A wide size Fisher&Paykel Nasal Evora (Nasal) mask was used for the titration. - Effort should be made to optimize nasal and oropahyngeal patency. - Avoid alcohol, sedatives and other CNS depressants that may  worsen sleep apnea and disrupt normal sleep architecture. - Sleep hygiene should be reviewed to assess factors that may improve sleep quality. - Weight management [facility])  and regular  exercise should be initiated or continued. - Recommend a download and sleep clinic evaluation after 4 weeks of therapy.   [Electronically signed] 12/04/2021 06:36 PM  Shelva Majestic MD, [facility], [place], American Board of Sleep Medicine  NPI: [id number]  [place] PH: [phone]   FX: [phone] [place]

## 2022-03-06 ENCOUNTER — Encounter: Payer: Self-pay | Admitting: Acute Care

## 2022-04-07 ENCOUNTER — Ambulatory Visit (HOSPITAL_COMMUNITY)
Admission: RE | Admit: 2022-04-07 | Discharge: 2022-04-07 | Disposition: A | Discharge status: Home / Self Care | Payer: BC Managed Care – PPO | Source: Ambulatory Visit | Attending: Family Medicine | Admitting: Family Medicine

## 2022-04-07 DIAGNOSIS — Z87891 Personal history of nicotine dependence: Secondary | ICD-10-CM | POA: Insufficient documentation

## 2022-04-07 DIAGNOSIS — F17210 Nicotine dependence, cigarettes, uncomplicated: Secondary | ICD-10-CM | POA: Insufficient documentation

## 2022-04-10 ENCOUNTER — Other Ambulatory Visit: Payer: Self-pay | Admitting: Acute Care

## 2022-04-10 DIAGNOSIS — Z122 Encounter for screening for malignant neoplasm of respiratory organs: Secondary | ICD-10-CM

## 2022-04-10 DIAGNOSIS — F17210 Nicotine dependence, cigarettes, uncomplicated: Secondary | ICD-10-CM

## 2022-04-10 DIAGNOSIS — Z87891 Personal history of nicotine dependence: Secondary | ICD-10-CM

## 2022-06-19 ENCOUNTER — Inpatient Hospital Stay (HOSPITAL_COMMUNITY)
Admission: EM | Admit: 2022-06-19 | Discharge: 2022-06-21 | DRG: 281 | Disposition: A | Discharge status: Home / Self Care | Payer: BC Managed Care – PPO | Attending: Cardiology | Admitting: Cardiology

## 2022-06-19 ENCOUNTER — Emergency Department (HOSPITAL_COMMUNITY): Payer: BC Managed Care – PPO

## 2022-06-19 ENCOUNTER — Other Ambulatory Visit: Payer: Self-pay

## 2022-06-19 ENCOUNTER — Encounter (HOSPITAL_COMMUNITY): Payer: Self-pay

## 2022-06-19 DIAGNOSIS — I10 Essential (primary) hypertension: Secondary | ICD-10-CM | POA: Diagnosis not present

## 2022-06-19 DIAGNOSIS — I2510 Atherosclerotic heart disease of native coronary artery without angina pectoris: Secondary | ICD-10-CM | POA: Insufficient documentation

## 2022-06-19 DIAGNOSIS — Z794 Long term (current) use of insulin: Secondary | ICD-10-CM

## 2022-06-19 DIAGNOSIS — E119 Type 2 diabetes mellitus without complications: Secondary | ICD-10-CM | POA: Diagnosis not present

## 2022-06-19 DIAGNOSIS — Z823 Family history of stroke: Secondary | ICD-10-CM

## 2022-06-19 DIAGNOSIS — Z833 Family history of diabetes mellitus: Secondary | ICD-10-CM

## 2022-06-19 DIAGNOSIS — Z7984 Long term (current) use of oral hypoglycemic drugs: Secondary | ICD-10-CM

## 2022-06-19 DIAGNOSIS — E782 Mixed hyperlipidemia: Secondary | ICD-10-CM | POA: Diagnosis not present

## 2022-06-19 DIAGNOSIS — Z8249 Family history of ischemic heart disease and other diseases of the circulatory system: Secondary | ICD-10-CM | POA: Diagnosis not present

## 2022-06-19 DIAGNOSIS — Z7982 Long term (current) use of aspirin: Secondary | ICD-10-CM

## 2022-06-19 DIAGNOSIS — E785 Hyperlipidemia, unspecified: Secondary | ICD-10-CM | POA: Insufficient documentation

## 2022-06-19 DIAGNOSIS — Z720 Tobacco use: Secondary | ICD-10-CM | POA: Insufficient documentation

## 2022-06-19 DIAGNOSIS — R079 Chest pain, unspecified: Secondary | ICD-10-CM | POA: Diagnosis present

## 2022-06-19 DIAGNOSIS — I214 Non-ST elevation (NSTEMI) myocardial infarction: Secondary | ICD-10-CM | POA: Diagnosis not present

## 2022-06-19 DIAGNOSIS — G4733 Obstructive sleep apnea (adult) (pediatric): Secondary | ICD-10-CM | POA: Diagnosis present

## 2022-06-19 DIAGNOSIS — F17210 Nicotine dependence, cigarettes, uncomplicated: Secondary | ICD-10-CM | POA: Diagnosis present

## 2022-06-19 DIAGNOSIS — Z79899 Other long term (current) drug therapy: Secondary | ICD-10-CM

## 2022-06-19 DIAGNOSIS — I472 Ventricular tachycardia, unspecified: Secondary | ICD-10-CM | POA: Diagnosis not present

## 2022-06-19 DIAGNOSIS — E118 Type 2 diabetes mellitus with unspecified complications: Secondary | ICD-10-CM

## 2022-06-19 DIAGNOSIS — I252 Old myocardial infarction: Secondary | ICD-10-CM | POA: Diagnosis present

## 2022-06-19 LAB — CBC
HCT: 46.4 % (ref 39.0–52.0)
Hemoglobin: 15.1 g/dL (ref 13.0–17.0)
MCH: 27.7 pg (ref 26.0–34.0)
MCHC: 32.5 g/dL (ref 30.0–36.0)
MCV: 85.1 fL (ref 80.0–100.0)
Platelets: 270 10*3/uL (ref 150–400)
RBC: 5.45 MIL/uL (ref 4.22–5.81)
RDW: 13.2 % (ref 11.5–15.5)
WBC: 6.1 10*3/uL (ref 4.0–10.5)
nRBC: 0 % (ref 0.0–0.2)

## 2022-06-19 LAB — GLUCOSE, CAPILLARY: Glucose-Capillary: 138 mg/dL — ABNORMAL HIGH (ref 70–99)

## 2022-06-19 LAB — BASIC METABOLIC PANEL
Anion gap: 10 (ref 5–15)
BUN: 13 mg/dL (ref 6–20)
CO2: 21 mmol/L — ABNORMAL LOW (ref 22–32)
Calcium: 8.6 mg/dL — ABNORMAL LOW (ref 8.9–10.3)
Chloride: 106 mmol/L (ref 98–111)
Creatinine, Ser: 0.76 mg/dL (ref 0.61–1.24)
GFR, Estimated: >60 mL/min (ref >60)
Glucose, Bld: 158 mg/dL — ABNORMAL HIGH (ref 70–99)
Potassium: 3.8 mmol/L (ref 3.5–5.1)
Sodium: 137 mmol/L (ref 135–145)

## 2022-06-19 LAB — TROPONIN I (HIGH SENSITIVITY)
Troponin I (High Sensitivity): 66 ng/L — ABNORMAL HIGH (ref <18)
Troponin I (High Sensitivity): 572 ng/L (ref <18)

## 2022-06-19 LAB — MRSA NEXT GEN BY PCR, NASAL: MRSA by PCR Next Gen: NOT DETECTED

## 2022-06-19 MED ORDER — ACETAMINOPHEN 325 MG PO TABS
650.0000 mg | ORAL_TABLET | ORAL | Status: DC | PRN
  Administered 2022-06-20: 650 mg via ORAL
  Filled 2022-06-19: qty 2

## 2022-06-19 MED ORDER — INSULIN DETEMIR 100 UNIT/ML ~~LOC~~ SOLN
20.0000 [IU] | Freq: Every evening | SUBCUTANEOUS | Status: DC
  Administered 2022-06-20: 20 [IU] via SUBCUTANEOUS
  Filled 2022-06-19 (×2): qty 0.2

## 2022-06-19 MED ORDER — ASPIRIN 81 MG PO TBEC: 81.0000 mg | DELAYED_RELEASE_TABLET | Freq: Every morning | ORAL | Status: DC

## 2022-06-19 MED ORDER — ASPIRIN 300 MG RE SUPP: 300.0000 mg | RECTAL | Status: AC

## 2022-06-19 MED ORDER — ASPIRIN 81 MG PO CHEW: 324.0000 mg | CHEWABLE_TABLET | ORAL | Status: AC

## 2022-06-19 MED ORDER — SODIUM CHLORIDE 0.9 % IV SOLN: 250.0000 mL | INTRAVENOUS | Status: DC | PRN

## 2022-06-19 MED ORDER — MORPHINE SULFATE (PF) 4 MG/ML IV SOLN
4.0000 mg | Freq: Once | INTRAVENOUS | Status: AC
  Administered 2022-06-19: 4 mg via INTRAVENOUS
  Filled 2022-06-19: qty 1

## 2022-06-19 MED ORDER — ZOLPIDEM TARTRATE 5 MG PO TABS
5.0000 mg | ORAL_TABLET | Freq: Every evening | ORAL | Status: DC | PRN
  Filled 2022-06-19: qty 1

## 2022-06-19 MED ORDER — NITROGLYCERIN IN D5W 200-5 MCG/ML-% IV SOLN
0.0000 ug/min | INTRAVENOUS | Status: AC
  Administered 2022-06-19: 5 ug/min via INTRAVENOUS
  Filled 2022-06-19: qty 250

## 2022-06-19 MED ORDER — CARVEDILOL 6.25 MG PO TABS
6.2500 mg | ORAL_TABLET | Freq: Two times a day (BID) | ORAL | Status: DC
  Administered 2022-06-20 – 2022-06-21 (×3): 6.25 mg via ORAL
  Filled 2022-06-19 (×3): qty 1

## 2022-06-19 MED ORDER — SODIUM CHLORIDE 0.9% FLUSH: 3.0000 mL | INTRAVENOUS | Status: DC | PRN

## 2022-06-19 MED ORDER — ASPIRIN 81 MG PO CHEW
324.0000 mg | CHEWABLE_TABLET | Freq: Once | ORAL | Status: AC
  Administered 2022-06-19: 324 mg via ORAL
  Filled 2022-06-19: qty 4

## 2022-06-19 MED ORDER — SODIUM CHLORIDE 0.9 % IV SOLN: INTRAVENOUS | Status: DC

## 2022-06-19 MED ORDER — SODIUM CHLORIDE 0.9% FLUSH
3.0000 mL | Freq: Two times a day (BID) | INTRAVENOUS | Status: DC
  Administered 2022-06-19 – 2022-06-21 (×2): 3 mL via INTRAVENOUS

## 2022-06-19 MED ORDER — NIFEDIPINE ER OSMOTIC RELEASE 60 MG PO TB24
90.0000 mg | ORAL_TABLET | Freq: Every day | ORAL | Status: DC
  Administered 2022-06-20 – 2022-06-21 (×2): 90 mg via ORAL
  Filled 2022-06-19 (×2): qty 1

## 2022-06-19 MED ORDER — SPIRONOLACTONE 25 MG PO TABS
25.0000 mg | ORAL_TABLET | Freq: Every day | ORAL | Status: DC
  Administered 2022-06-20 – 2022-06-21 (×2): 25 mg via ORAL
  Filled 2022-06-19 (×2): qty 1

## 2022-06-19 MED ORDER — ONDANSETRON HCL 4 MG/2ML IJ SOLN: 4.0000 mg | Freq: Four times a day (QID) | INTRAMUSCULAR | Status: DC | PRN

## 2022-06-19 MED ORDER — HEPARIN BOLUS VIA INFUSION
4000.0000 [IU] | Freq: Once | INTRAVENOUS | Status: AC
  Administered 2022-06-19: 4000 [IU] via INTRAVENOUS
  Filled 2022-06-19: qty 4000

## 2022-06-19 MED ORDER — ATORVASTATIN CALCIUM 80 MG PO TABS
80.0000 mg | ORAL_TABLET | Freq: Every day | ORAL | Status: DC
  Administered 2022-06-20: 80 mg via ORAL
  Filled 2022-06-19: qty 1

## 2022-06-19 MED ORDER — INSULIN ASPART 100 UNIT/ML IJ SOLN
0.0000 [IU] | Freq: Three times a day (TID) | INTRAMUSCULAR | Status: DC
  Administered 2022-06-20 – 2022-06-21 (×2): 2 [IU] via SUBCUTANEOUS

## 2022-06-19 MED ORDER — EMPAGLIFLOZIN 10 MG PO TABS
10.0000 mg | ORAL_TABLET | Freq: Every day | ORAL | Status: DC
  Administered 2022-06-20 – 2022-06-21 (×2): 10 mg via ORAL
  Filled 2022-06-19 (×2): qty 1

## 2022-06-19 MED ORDER — NITROGLYCERIN 0.4 MG SL SUBL: 0.4000 mg | SUBLINGUAL_TABLET | SUBLINGUAL | Status: DC | PRN

## 2022-06-19 MED ORDER — NITROGLYCERIN 0.4 MG SL SUBL
0.4000 mg | SUBLINGUAL_TABLET | SUBLINGUAL | Status: AC | PRN
  Administered 2022-06-19 (×3): 0.4 mg via SUBLINGUAL
  Filled 2022-06-19 (×2): qty 1

## 2022-06-19 MED ORDER — ASPIRIN 81 MG PO TBEC
81.0000 mg | DELAYED_RELEASE_TABLET | Freq: Every day | ORAL | Status: DC
  Administered 2022-06-21: 81 mg via ORAL
  Filled 2022-06-19: qty 1

## 2022-06-19 MED ORDER — HEPARIN (PORCINE) 25000 UT/250ML-% IV SOLN
1600.0000 [IU]/h | INTRAVENOUS | Status: DC
  Administered 2022-06-19: 1350 [IU]/h via INTRAVENOUS
  Filled 2022-06-19: qty 250

## 2022-06-19 MED ORDER — CHLORHEXIDINE GLUCONATE CLOTH 2 % EX PADS
6.0000 | MEDICATED_PAD | Freq: Every day | CUTANEOUS | Status: DC
  Administered 2022-06-19 – 2022-06-21 (×3): 6 via TOPICAL

## 2022-06-19 MED ORDER — ASPIRIN 81 MG PO CHEW
81.0000 mg | CHEWABLE_TABLET | ORAL | Status: AC
  Administered 2022-06-20: 81 mg via ORAL
  Filled 2022-06-19: qty 1

## 2022-06-19 MED ORDER — LOSARTAN POTASSIUM 50 MG PO TABS
100.0000 mg | ORAL_TABLET | Freq: Every day | ORAL | Status: DC
  Administered 2022-06-20: 100 mg via ORAL
  Filled 2022-06-19: qty 2

## 2022-06-19 MED ORDER — SODIUM CHLORIDE 0.9 % WEIGHT BASED INFUSION: 1.0000 mL/kg/h | INTRAVENOUS | Status: DC

## 2022-06-19 MED ORDER — INSULIN ASPART 100 UNIT/ML IJ SOLN: 0.0000 [IU] | Freq: Every day | INTRAMUSCULAR | Status: DC

## 2022-06-19 MED ORDER — ASPIRIN 81 MG PO TBEC
81.0000 mg | DELAYED_RELEASE_TABLET | Freq: Every day | ORAL | Status: DC
Start: 2022-06-20 — End: 2022-06-19

## 2022-06-19 MED ORDER — SODIUM CHLORIDE 0.9 % WEIGHT BASED INFUSION
3.0000 mL/kg/h | INTRAVENOUS | Status: DC
  Administered 2022-06-20: 3 mL/kg/h via INTRAVENOUS

## 2022-06-19 NOTE — ED Triage Notes (Signed)
Pt c/o central chest pressure starting this morning.  Pain score 8/10.  Pt denies associated symptoms.  Pt has not taken HTN medication today.  Sts he is normally compliant w/ all meds.

## 2022-06-19 NOTE — ED Notes (Signed)
Report called to Casimiro Needle, RN at [facility] ED.

## 2022-06-19 NOTE — ED Notes (Signed)
BP 211/138. Notified Jeraldine Loots, MD and Ross, Medic

## 2022-06-19 NOTE — ED Provider Notes (Signed)
[name] EMERGENCY DEPARTMENT AT [facility] Provider Note   CSN: [id number] Arrival date & time: 06/19/22  1346     History  Chief Complaint  Patient presents with   Chest Pain    Jorge Green is a 52 y.o. male.  HPI With history of hypertension, diabetes, current smoking now presents with chest pressure that began about 12 hours ago. Since onset pain has been persistent nonradiating, with minimal dyspnea. No vomiting, no confusion, no fever. Patient has a cardiologist but has had no prior cardiac testing.     Home Medications Prior to Admission medications   Medication Sig Start Date End Date Taking? Authorizing Provider  aspirin EC 81 MG tablet Take 81 mg by mouth daily.    [provider]  atorvastatin (LIPITOR) 20 MG tablet Take 20 mg by mouth daily. 07/10/19   [provider]  carvedilol (COREG) 25 MG tablet Take 25 mg by mouth in the morning and at bedtime. 09/19/19   [provider]  empagliflozin (JARDIANCE) 10 MG TABS tablet 1 tablet 05/30/21   [provider]  hydrochlorothiazide (HYDRODIURIL) 25 MG tablet Take 1 tablet (25 mg total) by mouth daily. 03/29/20   Harlene Salts A, PA-C  insulin glargine (LANTUS SOLOSTAR) 100 UNIT/ML Solostar Pen Inject 38 Units into the skin at bedtime.    [provider]  lisinopril-hydrochlorothiazide (PRINZIDE,ZESTORETIC) 20-12.5 MG per tablet Take 1 tablet by mouth daily. NEED VISIT/LABS! Patient not taking: Reported on 06/09/2021 03/05/12   Godfrey Pick, PA-C  losartan (COZAAR) 100 MG tablet Take 100 mg by mouth daily. 08/14/19   [provider]  metFORMIN (GLUCOPHAGE) 500 MG tablet Take 1 tablet (500 mg total) by mouth 2 (two) times daily with a meal. 03/29/20   Harlene Salts A, PA-C  methocarbamol (ROBAXIN) 500 MG tablet Take 500 mg by mouth 3 (three) times daily as needed for muscle spasms. 08/06/18   [provider]  Multiple Vitamin (MULTIVITAMIN WITH  MINERALS) TABS tablet Take 1 tablet by mouth daily.    [provider]  naproxen (NAPROSYN) 500 MG tablet Take 500 mg by mouth 2 (two) times daily as needed for mild pain. 08/06/18   [provider]  NIFEdipine (ADALAT CC) 90 MG 24 hr tablet Take 90 mg by mouth daily.    [provider]  Potassium 99 MG TABS Take 1 tablet by mouth daily.    [provider]  spironolactone (ALDACTONE) 25 MG tablet Take 25 mg by mouth daily. 08/14/19   [provider]  omeprazole (PRILOSEC) 20 MG capsule Take 1 capsule (20 mg total) by mouth daily. Patient not taking: No sig reported 04/30/17 03/29/20  [name], MD      Allergies    Patient has no known allergies.    Review of Systems   Review of Systems  All other systems reviewed and are negative.   Physical Exam Updated Vital Signs BP (!) 176/114   Pulse 73   Temp 98 F (36.7 C) (Oral)   Resp 20   Ht 5\' 11"  (1.803 m)   Wt 104.8 kg   SpO2 95%   BMI 32.22 kg/m  Physical Exam Vitals and nursing note reviewed.  Constitutional:      General: He is not in acute distress.    Appearance: He is well-developed.  HENT:     Head: Normocephalic and atraumatic.  Eyes:     Conjunctiva/sclera: Conjunctivae normal.  Cardiovascular:     Rate  and Rhythm: Normal rate and regular rhythm.  Pulmonary:     Effort: Pulmonary effort is normal. No respiratory distress.     Breath sounds: No stridor.  Abdominal:     General: There is no distension.  Skin:    General: Skin is warm and dry.  Neurological:     Mental Status: He is alert and oriented to person, place, and time.     ED Results / Procedures / Treatments   Labs (all labs ordered are listed, but only abnormal results are displayed) Labs Reviewed  BASIC METABOLIC PANEL - Abnormal; Notable for the following components:      Result Value   CO2 21 (*)    Glucose, Bld 158 (*)    Calcium 8.6 (*)    All other components within normal limits  TROPONIN I  (HIGH SENSITIVITY) - Abnormal; Notable for the following components:   Troponin I (High Sensitivity) 66 (*)    All other components within normal limits  TROPONIN I (HIGH SENSITIVITY) - Abnormal; Notable for the following components:   Troponin I (High Sensitivity) 572 (*)    All other components within normal limits  CBC  HEPARIN LEVEL (UNFRACTIONATED)  CBC    EKG EKG Interpretation  Date/Time:  Monday June 19 2022 13:56:58 EDT Ventricular Rate:  89 PR Interval:  154 QRS Duration: 88 QT Interval:  377 QTC Calculation: 459 R Axis:   -18 Text Interpretation: Sinus rhythm Borderline left axis deviation Baseline wander Confirmed by Gerhard Munch [phone]) on 06/19/2022 4:57:30 PM  Radiology DG Chest 2 View  Result Date: 06/19/2022 CLINICAL DATA:  Chest pressure EXAM: CHEST - 2 VIEW COMPARISON:  04/29/2017 FINDINGS: Cardiac and mediastinal contours are within normal limits. No focal pulmonary opacity. No pleural effusion or pneumothorax. No acute osseous abnormality. IMPRESSION: No acute cardiopulmonary process. Electronically Signed   By: Wiliam Ke M.D.   On: 06/19/2022 14:41    Procedures Procedures    Medications Ordered in ED Medications  heparin bolus via infusion 4,000 Units (has no administration in time range)  heparin ADULT infusion 100 units/mL (25000 units/220mL) (has no administration in time range)  aspirin chewable tablet 324 mg (324 mg Oral Given 06/19/22 1413)  nitroGLYCERIN (NITROSTAT) SL tablet 0.4 mg (0.4 mg Sublingual Given 06/19/22 1808)  morphine (PF) 4 MG/ML injection 4 mg (4 mg Intravenous Given 06/19/22 1818)    ED Course/ Medical Decision Making/ A&P                             Medical Decision Making Patient with hypertension, diabetes, smoking history presents with chest pain.  Patient has elevated risk profile, concern for ACS versus other intra thoracic pathology.  Absence of neuro complaints, findings somewhat reassuring for low suspicion of  dissection.   Amount and/or Complexity of Data Reviewed External Data Reviewed: notes. Labs: ordered. Decision-making details documented in ED Course. Radiology: ordered and independent interpretation performed. Decision-making details documented in ED Course. ECG/medicine tests: ordered and independent interpretation performed. Decision-making details documented in ED Course.  Risk Prescription drug management. Decision regarding hospitalization. Diagnosis or treatment significantly limited by social determinants of health.   6:36 PM After the initial evaluation the patient's pain increased, and he was provided nitroglycerin.  Now, on repeat evaluation the pain is now 8/10, blood pressure has diminished from greater than 200 systolic now 175. With persistent pain patient will continue to receive nitroglycerin, morphine. Second troponin now available,  tenfold increased compared to prior.  ECG unchanged. I discussed the patient's case with Dr. Gaynelle Arabian from our cardiology team.  Patient was transferred from the emergency department to our affiliated emergency department with anticipated cardiology evaluation and admission due to concern for NSTEMI.         Final Clinical Impression(s) / ED Diagnoses Final diagnoses:  NSTEMI (non-ST elevated myocardial infarction) (HCC)   CRITICAL CARE Performed by: Gerhard Munch Total critical care time: 40 minutes Critical care time was exclusive of separately billable procedures and treating other patients. Critical care was necessary to treat or prevent imminent or life-threatening deterioration. Critical care was time spent personally by me on the following activities: development of treatment plan with patient and/or surrogate as well as nursing, discussions with consultants, evaluation of patient's response to treatment, examination of patient, obtaining history from patient or surrogate, ordering and performing treatments and  interventions, ordering and review of laboratory studies, ordering and review of radiographic studies, pulse oximetry and re-evaluation of patient's condition.     Gerhard Munch, MD 06/19/22 [phone]

## 2022-06-19 NOTE — ED Notes (Signed)
ED TO INPATIENT HANDOFF REPORT  ED Nurse Name and Phone #:  Gillis Ends #[phone]  S Name/Age/Gender Jorge Green 52 y.o. male Room/Bed: 026C/026C  Code Status   Code Status: Full Code  Home/SNF/Other Home Patient oriented to: self, place, time, and situation Is this baseline? Yes   Triage Complete: Triage complete  Chief Complaint NSTEMI (non-ST elevated myocardial infarction) (HCC) [I21.4]  Triage Note Pt c/o central chest pressure starting this morning.  Pain score 8/10.  Pt denies associated symptoms.  Pt has not taken HTN medication today.  Sts he is normally compliant w/ all meds.    Allergies No Known Allergies  Level of Care/Admitting Diagnosis ED Disposition     ED Disposition  Admit   Condition  --   Comment  Hospital [facility]: [facility] [[id number]]  Level of Care: ICU [6]  May admit patient to [facility] or [facility] if equivalent level of care is available:: No  Covid Evaluation: Asymptomatic - no recent exposure (last 10 days) testing not required  Diagnosis: NSTEMI (non-ST elevated myocardial infarction) [facility]) [[id number]]  Admitting Physician: Parke Poisson [[id number]]  Attending Physician: Parke Poisson [[id number]]  Certification:: I certify this patient will need inpatient services for at least 2 midnights  Estimated Length of Stay: 2          B Medical/Surgery History Past Medical History:  Diagnosis Date   Diabetes mellitus without complication (HCC)    Hypertension    Past Surgical History:  Procedure Laterality Date   ANKLE SURGERY       A IV Location/Drains/Wounds Patient Lines/Drains/Airways Status     Active Line/Drains/Airways     Name Placement date Placement time Site Days   Peripheral IV 06/19/22 20 G 1" Anterior;Distal;Left;Upper Arm 06/19/22  1818  Arm  less than 1   Peripheral IV 06/19/22 20 G Right Antecubital 06/19/22  1849  Antecubital  less than 1            Intake/Output Last 24 hours No  intake or output data in the 24 hours ending 06/19/22 2018  Labs/Imaging Results for orders placed or performed during the hospital encounter of 06/19/22 (from the past 48 hour(s))  Basic metabolic panel     Status: Abnormal   Collection Time: 06/19/22  2:11 PM  Result Value Ref Range   Sodium 137 135 - 145 mmol/L   Potassium 3.8 3.5 - 5.1 mmol/L   Chloride 106 98 - 111 mmol/L   CO2 21 (L) 22 - 32 mmol/L   Glucose, Bld 158 (H) 70 - 99 mg/dL    Comment: Glucose reference range applies only to samples taken after fasting for at least 8 hours.   BUN 13 6 - 20 mg/dL   Creatinine, Ser 2.13 0.61 - 1.24 mg/dL   Calcium 8.6 (L) 8.9 - 10.3 mg/dL   GFR, Estimated >08 >65 mL/min    Comment: (NOTE) Calculated using the CKD-EPI Creatinine Equation (2021)    Anion gap 10 5 - 15    Comment: Performed at [facility], [address] [place], [place] [address]  CBC     Status: None   Collection Time: 06/19/22  2:11 PM  Result Value Ref Range   WBC 6.1 4.0 - 10.5 K/uL   RBC 5.45 4.22 - 5.81 MIL/uL   Hemoglobin 15.1 13.0 - 17.0 g/dL   HCT 62.9 52.8 - 41.3 %   MCV 85.1 80.0 - 100.0 fL   MCH 27.7  26.0 - 34.0 pg   MCHC 32.5 30.0 - 36.0 g/dL   RDW 65.7 84.6 - 96.2 %   Platelets 270 150 - 400 K/uL   nRBC 0.0 0.0 - 0.2 %    Comment: Performed at [facility], [address] [place], [place] [address]  Troponin I (High Sensitivity)     Status: Abnormal   Collection Time: 06/19/22  2:11 PM  Result Value Ref Range   Troponin I (High Sensitivity) 66 (H) <18 ng/L    Comment: (NOTE) Elevated high sensitivity troponin I (hsTnI) values and significant  changes across serial measurements may suggest ACS but many other  chronic and acute conditions are known to elevate hsTnI results.  Refer to the "Links" section for chest pain algorithms and additional  guidance. Performed at [facility], [address], [place], [place] [address]   Troponin  I (High Sensitivity)     Status: Abnormal   Collection Time: 06/19/22  5:06 PM  Result Value Ref Range   Troponin I (High Sensitivity) 572 (HH) <18 ng/L    Comment: CRITICAL RESULT CALLED TO, READ BACK BY AND VERIFIED WITH RN Glynn Octave AT 1809 06/19/22 [place] (NOTE) Elevated high sensitivity troponin I (hsTnI) values and significant  changes across serial measurements may suggest ACS but many other  chronic and acute conditions are known to elevate hsTnI results.  Refer to the "Links" section for chest pain algorithms and additional  guidance. Performed at [facility], [address] [place], [place] [address]    DG Chest 2 View  Result Date: 06/19/2022 CLINICAL DATA:  Chest pressure EXAM: CHEST - 2 VIEW COMPARISON:  04/29/2017 FINDINGS: Cardiac and mediastinal contours are within normal limits. No focal pulmonary opacity. No pleural effusion or pneumothorax. No acute osseous abnormality. IMPRESSION: No acute cardiopulmonary process. Electronically Signed   By: Wiliam Ke M.D.   On: 06/19/2022 14:41    Pending Labs Unresulted Labs (From admission, onward)     Start     Ordered   06/21/22 0500  Heparin level (unfractionated)  Daily,   R      06/19/22 2000   06/20/22 0500  CBC  Daily,   R      06/19/22 2000   06/20/22 0100  Heparin level (unfractionated)  Once-Timed,   TIMED        06/19/22 2000   Signed and Held  HIV Antibody (routine testing w rflx)  (HIV Antibody (Routine testing w reflex) panel)  Once,   R        Signed and Held   Signed and Held  Lipoprotein A (LPA)  Tomorrow morning,   R        Signed and Held   Signed and Held  Magnesium  Once,   R        Signed and Held   Signed and Held  Hepatic function panel  Once,   R        Signed and Held   Signed and Held  TSH  Once,   R        Signed and Held   Signed and Held  T4, free  Once,   R        Signed and Held   Signed and Held  Hemoglobin A1c  Once,   R        Signed and Held   Signed and  Held  Lipid panel  Tomorrow morning,   R  Signed and Held   Signed and Held  Basic metabolic panel  Tomorrow morning,   R        Signed and Held   Signed and Held  CBC  Tomorrow morning,   R        Signed and Held            Vitals/Pain Today's Vitals   06/19/22 1821 06/19/22 1930 06/19/22 1931 06/19/22 2005  BP:  (!) 150/103  (!) 159/109  Pulse:  74  78  Resp:  20  17  Temp:   98.5 F (36.9 C)   TempSrc:   Oral   SpO2:  100%  100%  Weight:      Height:      PainSc: 8        Isolation Precautions No active isolations  Medications Medications  heparin ADULT infusion 100 units/mL (25000 units/276mL) (1,350 Units/hr Intravenous New Bag/Given 06/19/22 1849)  nitroGLYCERIN 50 mg in dextrose 5 % 250 mL (0.2 mg/mL) infusion (5 mcg/min Intravenous New Bag/Given 06/19/22 2009)  insulin aspart (novoLOG) injection 0-9 Units (has no administration in time range)  insulin aspart (novoLOG) injection 0-5 Units (has no administration in time range)  sodium chloride flush (NS) 0.9 % injection 3 mL (has no administration in time range)  aspirin chewable tablet 324 mg (324 mg Oral Given 06/19/22 1413)  nitroGLYCERIN (NITROSTAT) SL tablet 0.4 mg (0.4 mg Sublingual Given 06/19/22 1808)  morphine (PF) 4 MG/ML injection 4 mg (4 mg Intravenous Given 06/19/22 1818)  heparin bolus via infusion 4,000 Units (4,000 Units Intravenous Bolus from Bag 06/19/22 1849)    Mobility walks     Focused Assessments Cardiac Assessment Handoff:    No results found for: "CKTOTAL", "CKMB", "CKMBINDEX", "TROPONINI" No results found for: "DDIMER" Does the Patient currently have chest pain? Yes    R Recommendations: See Admitting Provider Note  Report given to:   Additional Notes:

## 2022-06-19 NOTE — ED Provider Triage Note (Signed)
Emergency Medicine Provider Triage Evaluation Note  Jorge Green , a 52 y.o. male  was evaluated in triage.  Pt complains of CP. Sternal CP since this am ~4am.  No SOB, nausea, vomitning.   Smoker.   States worse when driving to ER but not with walking or with breathing. No hemoptysis   Review of Systems  Positive: CP Negative: Fever   Physical Exam  BP (!) 178/112 (BP Location: Right Arm)   Pulse 88   Temp 98.8 F (37.1 C) (Oral)   Resp 18   Ht 5\' 11"  (1.803 m)   Wt 104.8 kg   SpO2 98%   BMI 32.22 kg/m  Gen:   Awake, no distress   Resp:  Normal effort  MSK:   Moves extremities without difficulty  Other:  Lungs clear, no LEE  Medical Decision Making  Medically screening exam initiated at 2:04 PM.  Appropriate orders placed.  Jorge Green was informed that the remainder of the evaluation will be completed by another provider, this initial triage assessment does not replace that evaluation, and the importance of remaining in the ED until their evaluation is complete.  Labs, EKG    Jorge Green, [place] 06/19/22 1407

## 2022-06-19 NOTE — ED Notes (Signed)
Pt transferred from [facility] for inpatient Cardiology consult.  A&O x4, c/o chest pain 6/10. Heparin drip in progress @ 1350 units/hour.

## 2022-06-19 NOTE — Progress Notes (Signed)
ANTICOAGULATION CONSULT NOTE   Pharmacy Consult for heparin Indication: chest pain/ACS  No Known Allergies  Patient Measurements: Height: 5\' 11"  (180.3 cm) Weight: 104.8 kg (231 lb) IBW/kg (Calculated) : 75.3 Heparin Dosing Weight: 97 kg  Vital Signs: Temp: 98 F (36.7 C) (06/10 1746) Temp Source: Oral (06/10 1746) BP: 176/114 (06/10 1806) Pulse Rate: 73 (06/10 1806)  Labs: Recent Labs    06/19/22 1411 06/19/22 1706  HGB 15.1  --   HCT 46.4  --   PLT 270  --   CREATININE 0.76  --   TROPONINIHS 66* 572*    Estimated Creatinine Clearance: 133.1 mL/min (by C-G formula based on SCr of 0.76 mg/dL).   Assessment: Patient is a 52 y.o M with hx HTN who presented to the ED on 06/19/22 with c/o CP.  Troponin was found to be elevated.  Pharmacy has been consulted to dose heparin drip for ACS.  Today, 06/19/2022: - cbc ok - Troponin 572  Goal of Therapy:  Heparin level 0.3-0.7 units/ml Monitor platelets by anticoagulation protocol: Yes   Plan:  - heparin 4000 units IV x1 bolus, then drip at 1350 units/hr - check 6 hr heparin level - monitor for s/sx bleeding   Shany Marinez P 06/19/2022,6:14 PM

## 2022-06-19 NOTE — H&P (Addendum)
Cardiology Admission History and Physical   Patient ID: ROME Green MRN: [id number]; DOB: 04/13/1970   Admission date: 06/19/2022  PCP:  Carilyn Goodpasture, NP   [facility] Providers Cardiologist:  Reatha Harps, MD        Chief Complaint:  chest pain  Patient Profile:   Jorge Green is a 52 y.o. male with HTN, DM, Coronary calcifications, tobacco abuse, OSA, HLD on statin  who is being seen 06/19/2022 for the evaluation of chest pain.Marland Kitchen  History of Present Illness:   Jorge Green with above hx last seen in cardiology office 06/09/21 -he was on coreg, HCTZ, losartan nifedipine aldactone.  He has severe sleep apnea and did have sleep study and was to undergo titration. If does not seem that was completed.   On prior CT of chest he had coronary artery calcifications but at the time no angina.  He is on insulin for diabetes.   He is on lipitor for HLD.  + tobacco use.   Today pt presented to ER at [facility] with chest pressure mid sternal.  Pain 8/10 and pt had not taken his HTN meds though otherwise he is compliant.  The pain began around 0330 when he got up to go to work. No radiation, mild dyspnea, no diaphoresis.  The NTG did not help much nor did the morphine.  Still with pain on exam.    EKG:  The ECG that was done 06/19/22 was personally reviewed and demonstrates SR 89 and no acute ST changes.   Hs troponin 66 >>572  Na 137 k+ 3.8 BUN 13 Cr 0.76  WBC 6.1 Hgb 15.1 plts 270  2C CXR  NAD  BP on arrival 170/121 and now 176/114 P 70s R 20-22 afebrile sp02 98%  He is on IV heparin and has had ASA 324 mg.  NTG sl and morphine.   Past Medical History:  Diagnosis Date   Diabetes mellitus without complication (HCC)    Hypertension     Past Surgical History:  Procedure Laterality Date   ANKLE SURGERY       Medications Prior to Admission: Prior to Admission medications   Medication Sig Start Date End Date Taking? Authorizing Provider  aspirin EC 81 MG tablet Take 81 mg  by mouth daily.   Yes [provider]  atorvastatin (LIPITOR) 20 MG tablet Take 20 mg by mouth daily. 07/10/19  Yes [provider]  Continuous Glucose Sensor (FREESTYLE LIBRE 2 SENSOR) MISC Inject 1 Device into the skin every 14 (fourteen) days.   Yes [provider]  empagliflozin (JARDIANCE) 10 MG TABS tablet Take 10 mg by mouth daily. 05/30/21  Yes [provider]  LEVEMIR FLEXPEN 100 UNIT/ML FlexPen Inject 60 Units into the skin every evening.   Yes [provider]  losartan (COZAAR) 100 MG tablet Take 100 mg by mouth daily. 08/14/19  Yes [provider]  carvedilol (COREG) 25 MG tablet Take 25 mg by mouth in the morning and at bedtime. 09/19/19   [provider]  hydrochlorothiazide (HYDRODIURIL) 25 MG tablet Take 1 tablet (25 mg total) by mouth daily. 03/29/20   Bill Salinas, PA-C  lisinopril-hydrochlorothiazide (PRINZIDE,ZESTORETIC) 20-12.5 MG per tablet Take 1 tablet by mouth daily. NEED VISIT/LABS! Patient not taking: Reported on 06/09/2021 03/05/12   Godfrey Pick, PA-C  metFORMIN (GLUCOPHAGE) 500 MG tablet Take 1 tablet (500 mg total) by mouth 2 (two) times daily with a meal. 03/29/20   Harlene Salts  A, PA-C  methocarbamol (ROBAXIN) 500 MG tablet Take 500 mg by mouth 3 (three) times daily as needed for muscle spasms. 08/06/18   [provider]  Multiple Vitamin (MULTIVITAMIN WITH MINERALS) TABS tablet Take 1 tablet by mouth daily.    [provider]  naproxen (NAPROSYN) 500 MG tablet Take 500 mg by mouth 2 (two) times daily as needed for mild pain. 08/06/18   [provider]  NIFEdipine (ADALAT CC) 90 MG 24 hr tablet Take 90 mg by mouth daily.    [provider]  Potassium 99 MG TABS Take 1 tablet by mouth daily.    [provider]  spironolactone (ALDACTONE) 25 MG tablet Take 25 mg by mouth daily. 08/14/19   [provider]  omeprazole (PRILOSEC) 20 MG capsule Take 1 capsule  (20 mg total) by mouth daily. Patient not taking: No sig reported 04/30/17 03/29/20  [name], MD     Allergies:   No Known Allergies  Social History:   Social History   Socioeconomic History   Marital status: Married    Spouse name: Not on file   Number of children: 8   Years of education: Not on file   Highest education level: Not on file  Occupational History   Occupation: Floor Tech  Tobacco Use   Smoking status: Every Day    Packs/day: 1.00    Years: 34.00    Additional pack years: 0.00    Total pack years: 34.00    Types: Cigarettes   Smokeless tobacco: Never  Substance and Sexual Activity   Alcohol use: Never   Drug use: Yes    Types: Marijuana   Sexual activity: Yes    Partners: Female  Other Topics Concern   Not on file  Social History Narrative   Not on file   Social Determinants of Health   Financial Resource Strain: Not on file  Food Insecurity: Not on file  Transportation Needs: Not on file  Physical Activity: Not on file  Stress: Not on file  Social Connections: Not on file  Intimate Partner Violence: Not on file    Family History:   The patient's family history includes Diabetes in his mother; Hypertension in his mother; Stroke in his mother.    ROS:  Please see the history of present illness.  General:no colds or fevers, no weight changes Skin:no rashes or ulcers HEENT:no blurred vision, no congestion CV:see HPI PUL:see HPI GI:no diarrhea constipation or melena, no indigestion GU:no hematuria, no dysuria MS:no joint pain, no claudication Neuro:no syncope, no lightheadedness Endo:+ diabetes, no thyroid disease All other ROS reviewed and negative.     Physical Exam/Data:   Vitals:   06/19/22 1800 06/19/22 1806 06/19/22 1930 06/19/22 1931  BP: (!) 184/111 (!) 176/114 (!) 150/103   Pulse: 76 73 74   Resp: 15 20 20    Temp:    98.5 F (36.9 C)  TempSrc:    Oral  SpO2: 96% 95% 100%   Weight:      Height:       No intake or  output data in the 24 hours ending 06/19/22 1950    06/19/2022    1:57 PM 11/25/2021    8:11 PM 06/09/2021    8:38 AM  Last 3 Weights  Weight (lbs) 231 lb 231 lb 231 lb 9.6 oz  Weight (kg) 104.781 kg 104.781 kg 105.053 kg     Body mass index is 32.22 kg/m.  General:  Well nourished, well  developed, in no acute distress but still with chest pain HEENT: normal Neck: no JVD Vascular: No carotid bruits; Distal pulses 2+ bilaterally   Cardiac:  normal S1, S2; RRR; no murmur gallup rub or click Lungs:  clear to auscultation bilaterally, no wheezing, rhonchi or rales  Abd: soft, nontender, no hepatomegaly  Ext: no lower ext edema Musculoskeletal:  No deformities, BUE and BLE strength normal and equal Skin: warm and dry  Neuro:  alert and oriented X 3 MAE follows commands no focal abnormalities noted Psych:  Normal affect    Relevant CV Studies: none  Laboratory Data:  High Sensitivity Troponin:   Recent Labs  Lab 06/19/22 1411 06/19/22 1706  TROPONINIHS 66* 572*      Chemistry Recent Labs  Lab 06/19/22 1411  NA 137  K 3.8  CL 106  CO2 21*  GLUCOSE 158*  BUN 13  CREATININE 0.76  CALCIUM 8.6*  GFRNONAA >60  ANIONGAP 10    No results for input(s): "PROT", "ALBUMIN", "AST", "ALT", "ALKPHOS", "BILITOT" in the last 168 hours. Lipids No results for input(s): "CHOL", "TRIG", "HDL", "LABVLDL", "LDLCALC", "CHOLHDL" in the last 168 hours. Hematology Recent Labs  Lab 06/19/22 1411  WBC 6.1  RBC 5.45  HGB 15.1  HCT 46.4  MCV 85.1  MCH 27.7  MCHC 32.5  RDW 13.2  PLT 270   Thyroid No results for input(s): "TSH", "FREET4" in the last 168 hours. BNPNo results for input(s): "BNP", "PROBNP" in the last 168 hours.  DDimer No results for input(s): "DDIMER" in the last 168 hours.   Radiology/Studies:  DG Chest 2 View  Result Date: 06/19/2022 CLINICAL DATA:  Chest pressure EXAM: CHEST - 2 VIEW COMPARISON:  04/29/2017 FINDINGS: Cardiac and mediastinal contours are within  normal limits. No focal pulmonary opacity. No pleural effusion or pneumothorax. No acute osseous abnormality. IMPRESSION: No acute cardiopulmonary process. Electronically Signed   By: Wiliam Ke M.D.   On: 06/19/2022 14:41     Assessment and Plan:   NSTEMI with chest pain and hx coronary calcifications on CT of chest.  He is diabetic and with HLD.  He uses tobacco.   + troponin.  Will continue to check,  he has had ASA and now on IV heparin.  His BP is elevated and could be reason for angina. Continue home meds and if still with pain add IV NTG  he has had NTG sl and IV morphine.  Most likely will need cardiac cath in AM to further eval  check echo HTN poorly controlled today  has not had BP meds.  HLD continue statin and check lipids OSA with severe hypoxemia has had sleep study but not heard back so has not had DM on insulin - will hold metformin add SSI lower dose of levemir and diabetes coordinator consult tomorrow   The patient understands that risks included but are not limited to stroke (1 in 1000), death (1 in 1000), kidney failure [usually temporary] (1 in 500), bleeding (1 in 200), allergic reaction [possibly serious] (1 in 200).   Risk Assessment/Risk Scores:    TIMI Risk Score for Unstable Angina or Non-ST Elevation MI:   The patient's TIMI risk score is 4, which indicates a 20% risk of all cause mortality, new or recurrent myocardial infarction or need for urgent revascularization in the next 14 days.       Severity of Illness: The appropriate patient status for this patient is INPATIENT. Inpatient status is judged to be reasonable and  necessary in order to provide the required intensity of service to ensure the patient's safety. The patient's presenting symptoms, physical exam findings, and initial radiographic and laboratory data in the context of their chronic comorbidities is felt to place them at high risk for further clinical deterioration. Furthermore, it is not  anticipated that the patient will be medically stable for discharge from the hospital within 2 midnights of admission.   * I certify that at the point of admission it is my clinical judgment that the patient will require inpatient hospital care spanning beyond 2 midnights from the point of admission due to high intensity of service, high risk for further deterioration and high frequency of surveillance required.*   For questions or updates, please contact [facility] Please consult [email or web address] for contact info under     Signed, Nada Boozer, NP  06/19/2022 7:50 PM   Patient seen and examined with Nada Boozer NP.  Agree as above, with the following exceptions and changes as noted below.  52 year old gentleman presents with substernal chest discomfort worsened when with physical exertion at work today prompting presentation to the ER.  In ER troponins rose from 66 to 572.  Patient has not had an episode of chest pain like this ever before and had no preceding exertional symptoms despite his physical job as a Geologist, engineering at [employer].  EKG reviewed with no significant ST-T wave changes.  Gen: NAD, CV: RRR, no murmurs, radial, DP, PT pulses all 4/4, Lungs: clear, Abd: soft, Extrem: Warm, well perfused, no edema, Neuro/Psych: alert and oriented x 3, normal mood and affect. All available labs, radiology testing, previous records reviewed.  Patient noted to have NSTEMI with continued chest pain and elevated blood pressure on presentation with a known history of coronary artery calcifications on prior chest CT as well as history of smoking, untreated OSA, diabetes requiring insulin, hypertension and hyperlipidemia.  Findings support need for coronary artery angiography tomorrow for NSTEMI.  Discussed possible findings on cardiac catheterization in detail with patient's wife and daughter at the bedside and answered all questions.  Consent for cardiac catheterization obtained with family present.   Patient continues to have 5 out of 10 chest pain despite nitroglycerin and morphine, will initiate nitroglycerin infusion.  This will assist with blood pressure management as well.   INFORMED CONSENT: I have reviewed the risks, indications, and alternatives to cardiac catheterization, possible angioplasty, and stenting with the patient. Risks include but are not limited to bleeding, infection, vascular injury, stroke, myocardial infarction, arrhythmia, kidney injury, radiation-related injury in the case of prolonged fluoroscopy use, emergency cardiac surgery, and death. The patient understands the risks of serious complication is 1-2 in 1000 with diagnostic cardiac cath and 1-2% or less with angioplasty/stenting.    Parke Poisson, MD 06/19/22 8:10 PM

## 2022-06-20 ENCOUNTER — Telehealth (HOSPITAL_COMMUNITY): Payer: Self-pay | Admitting: Pharmacy Technician

## 2022-06-20 ENCOUNTER — Other Ambulatory Visit (HOSPITAL_COMMUNITY): Payer: BC Managed Care – PPO

## 2022-06-20 ENCOUNTER — Encounter (HOSPITAL_COMMUNITY)
Admission: EM | Disposition: A | Discharge status: Home / Self Care | Payer: Self-pay | Source: Home / Self Care | Attending: Internal Medicine

## 2022-06-20 ENCOUNTER — Other Ambulatory Visit (HOSPITAL_COMMUNITY): Payer: Self-pay

## 2022-06-20 ENCOUNTER — Inpatient Hospital Stay (HOSPITAL_COMMUNITY): Payer: BC Managed Care – PPO

## 2022-06-20 DIAGNOSIS — I2510 Atherosclerotic heart disease of native coronary artery without angina pectoris: Secondary | ICD-10-CM

## 2022-06-20 DIAGNOSIS — E119 Type 2 diabetes mellitus without complications: Secondary | ICD-10-CM

## 2022-06-20 DIAGNOSIS — Z794 Long term (current) use of insulin: Secondary | ICD-10-CM

## 2022-06-20 DIAGNOSIS — I10 Essential (primary) hypertension: Secondary | ICD-10-CM | POA: Diagnosis not present

## 2022-06-20 DIAGNOSIS — I214 Non-ST elevation (NSTEMI) myocardial infarction: Secondary | ICD-10-CM | POA: Diagnosis not present

## 2022-06-20 DIAGNOSIS — E782 Mixed hyperlipidemia: Secondary | ICD-10-CM | POA: Diagnosis not present

## 2022-06-20 HISTORY — PX: LEFT HEART CATH AND CORONARY ANGIOGRAPHY: CATH118249

## 2022-06-20 LAB — CBC
HCT: 43.7 % (ref 39.0–52.0)
Hemoglobin: 14.3 g/dL (ref 13.0–17.0)
MCH: 27.5 pg (ref 26.0–34.0)
MCHC: 32.7 g/dL (ref 30.0–36.0)
MCV: 84 fL (ref 80.0–100.0)
Platelets: 270 10*3/uL (ref 150–400)
RBC: 5.2 MIL/uL (ref 4.22–5.81)
RDW: 13.2 % (ref 11.5–15.5)
WBC: 6.3 10*3/uL (ref 4.0–10.5)
nRBC: 0 % (ref 0.0–0.2)

## 2022-06-20 LAB — ECHOCARDIOGRAM COMPLETE
AR max vel: 2.85 cm2
AV Area VTI: 2.47 cm2
AV Area mean vel: 2.64 cm2
AV Mean grad: 3 mmHg
AV Peak grad: 5.8 mmHg
Ao pk vel: 1.2 m/s
Area-P 1/2: 3.87 cm2
Height: 71 in
S' Lateral: 3.1 cm
Weight: 3664.93 oz

## 2022-06-20 LAB — MAGNESIUM: Magnesium: 2 mg/dL (ref 1.7–2.4)

## 2022-06-20 LAB — HEPATIC FUNCTION PANEL
ALT: 21 U/L (ref 0–44)
AST: 27 U/L (ref 15–41)
Albumin: 3.5 g/dL (ref 3.5–5.0)
Alkaline Phosphatase: 60 U/L (ref 38–126)
Bilirubin, Direct: 0.1 mg/dL (ref 0.0–0.2)
Indirect Bilirubin: 0.3 mg/dL (ref 0.3–0.9)
Total Bilirubin: 0.4 mg/dL (ref 0.3–1.2)
Total Protein: 6.2 g/dL — ABNORMAL LOW (ref 6.5–8.1)

## 2022-06-20 LAB — HIV ANTIBODY (ROUTINE TESTING W REFLEX): HIV Screen 4th Generation wRfx: NONREACTIVE

## 2022-06-20 LAB — GLUCOSE, CAPILLARY
Glucose-Capillary: 157 mg/dL — ABNORMAL HIGH (ref 70–99)
Glucose-Capillary: 123 mg/dL — ABNORMAL HIGH (ref 70–99)
Glucose-Capillary: 178 mg/dL — ABNORMAL HIGH (ref 70–99)

## 2022-06-20 LAB — TROPONIN I (HIGH SENSITIVITY)
Troponin I (High Sensitivity): 445 ng/L (ref <18)
Troponin I (High Sensitivity): 3154 ng/L (ref <18)

## 2022-06-20 LAB — T4, FREE: Free T4: 0.95 ng/dL (ref 0.61–1.12)

## 2022-06-20 LAB — LIPID PANEL
Cholesterol: 116 mg/dL (ref 0–200)
HDL: 24 mg/dL — ABNORMAL LOW (ref >40)
LDL Cholesterol: 44 mg/dL (ref 0–99)
Total CHOL/HDL Ratio: 4.8 RATIO
Triglycerides: 240 mg/dL — ABNORMAL HIGH (ref <150)
VLDL: 48 mg/dL — ABNORMAL HIGH (ref 0–40)

## 2022-06-20 LAB — TSH: TSH: 2.592 u[IU]/mL (ref 0.350–4.500)

## 2022-06-20 LAB — HEMOGLOBIN A1C
Hgb A1c MFr Bld: 7.6 % — ABNORMAL HIGH (ref 4.8–5.6)
Mean Plasma Glucose: 171.42 mg/dL

## 2022-06-20 LAB — HEPARIN LEVEL (UNFRACTIONATED)
Heparin Unfractionated: 0.4 IU/mL (ref 0.30–0.70)
Heparin Unfractionated: <0.1 IU/mL — ABNORMAL LOW (ref 0.30–0.70)

## 2022-06-20 SURGERY — LEFT HEART CATH AND CORONARY ANGIOGRAPHY
Anesthesia: LOCAL

## 2022-06-20 MED ORDER — HEPARIN BOLUS VIA INFUSION
3000.0000 [IU] | Freq: Once | INTRAVENOUS | Status: AC
  Administered 2022-06-20: 3000 [IU] via INTRAVENOUS
  Filled 2022-06-20: qty 3000

## 2022-06-20 MED ORDER — VERAPAMIL HCL 2.5 MG/ML IV SOLN
INTRAVENOUS | Status: AC
  Filled 2022-06-20: qty 2

## 2022-06-20 MED ORDER — FENTANYL CITRATE (PF) 100 MCG/2ML IJ SOLN
INTRAMUSCULAR | Status: DC | PRN
  Administered 2022-06-20: 25 ug via INTRAVENOUS

## 2022-06-20 MED ORDER — FENTANYL CITRATE (PF) 100 MCG/2ML IJ SOLN
INTRAMUSCULAR | Status: AC
  Filled 2022-06-20: qty 2

## 2022-06-20 MED ORDER — SODIUM CHLORIDE 0.9 % IV SOLN: 250.0000 mL | INTRAVENOUS | Status: DC | PRN

## 2022-06-20 MED ORDER — MORPHINE SULFATE (PF) 2 MG/ML IV SOLN: 2.0000 mg | INTRAVENOUS | Status: DC | PRN

## 2022-06-20 MED ORDER — ONDANSETRON HCL 4 MG/2ML IJ SOLN: 4.0000 mg | Freq: Four times a day (QID) | INTRAMUSCULAR | Status: DC | PRN

## 2022-06-20 MED ORDER — MIDAZOLAM HCL 2 MG/2ML IJ SOLN
INTRAMUSCULAR | Status: DC | PRN
  Administered 2022-06-20: 1 mg via INTRAVENOUS

## 2022-06-20 MED ORDER — HYDRALAZINE HCL 20 MG/ML IJ SOLN: 10.0000 mg | INTRAMUSCULAR | Status: AC | PRN

## 2022-06-20 MED ORDER — LIDOCAINE HCL (PF) 1 % IJ SOLN
INTRAMUSCULAR | Status: AC
  Filled 2022-06-20: qty 30

## 2022-06-20 MED ORDER — HEPARIN SODIUM (PORCINE) 1000 UNIT/ML IJ SOLN
INTRAMUSCULAR | Status: DC | PRN
  Administered 2022-06-20: 5000 [IU] via INTRAVENOUS

## 2022-06-20 MED ORDER — SODIUM CHLORIDE 0.9% FLUSH: 3.0000 mL | INTRAVENOUS | Status: DC | PRN

## 2022-06-20 MED ORDER — MIDAZOLAM HCL 2 MG/2ML IJ SOLN
INTRAMUSCULAR | Status: AC
  Filled 2022-06-20: qty 2

## 2022-06-20 MED ORDER — LABETALOL HCL 5 MG/ML IV SOLN: 10.0000 mg | INTRAVENOUS | Status: AC | PRN

## 2022-06-20 MED ORDER — ISOSORBIDE MONONITRATE ER 30 MG PO TB24
30.0000 mg | ORAL_TABLET | Freq: Every day | ORAL | Status: DC
  Administered 2022-06-20 – 2022-06-21 (×2): 30 mg via ORAL
  Filled 2022-06-20 (×2): qty 1

## 2022-06-20 MED ORDER — ATORVASTATIN CALCIUM 80 MG PO TABS: 80.0000 mg | ORAL_TABLET | Freq: Every day | ORAL | Status: DC

## 2022-06-20 MED ORDER — HEPARIN SODIUM (PORCINE) 1000 UNIT/ML IJ SOLN
INTRAMUSCULAR | Status: AC
  Filled 2022-06-20: qty 10

## 2022-06-20 MED ORDER — CLOPIDOGREL BISULFATE 300 MG PO TABS
300.0000 mg | ORAL_TABLET | Freq: Once | ORAL | Status: AC
  Administered 2022-06-20: 300 mg via ORAL
  Filled 2022-06-20: qty 1

## 2022-06-20 MED ORDER — VERAPAMIL HCL 2.5 MG/ML IV SOLN
INTRA_ARTERIAL | Status: DC | PRN
  Administered 2022-06-20: 5 mL via INTRA_ARTERIAL

## 2022-06-20 MED ORDER — NITROGLYCERIN 1 MG/10 ML FOR IR/CATH LAB
INTRA_ARTERIAL | Status: AC
  Filled 2022-06-20: qty 10

## 2022-06-20 MED ORDER — SODIUM CHLORIDE 0.9 % IV SOLN: INTRAVENOUS | Status: AC

## 2022-06-20 MED ORDER — ORAL CARE MOUTH RINSE: 15.0000 mL | OROMUCOSAL | Status: DC | PRN

## 2022-06-20 MED ORDER — ACETAMINOPHEN 325 MG PO TABS: 650.0000 mg | ORAL_TABLET | ORAL | Status: DC | PRN

## 2022-06-20 MED ORDER — ASPIRIN 81 MG PO CHEW: 81.0000 mg | CHEWABLE_TABLET | Freq: Every day | ORAL | Status: DC

## 2022-06-20 MED ORDER — IOHEXOL 350 MG/ML SOLN
INTRAVENOUS | Status: DC | PRN
  Administered 2022-06-20: 60 mL

## 2022-06-20 MED ORDER — LIDOCAINE HCL (PF) 1 % IJ SOLN
INTRAMUSCULAR | Status: DC | PRN
  Administered 2022-06-20: 2 mL via INTRADERMAL

## 2022-06-20 MED ORDER — ICOSAPENT ETHYL 1 G PO CAPS
2.0000 g | ORAL_CAPSULE | Freq: Two times a day (BID) | ORAL | Status: DC
  Administered 2022-06-20 – 2022-06-21 (×2): 2 g via ORAL
  Filled 2022-06-20 (×4): qty 2

## 2022-06-20 MED ORDER — SODIUM CHLORIDE 0.9% FLUSH
3.0000 mL | Freq: Two times a day (BID) | INTRAVENOUS | Status: DC
  Administered 2022-06-20 – 2022-06-21 (×2): 3 mL via INTRAVENOUS

## 2022-06-20 MED ORDER — HEPARIN (PORCINE) IN NACL 1000-0.9 UT/500ML-% IV SOLN
INTRAVENOUS | Status: DC | PRN
  Administered 2022-06-20 (×2): 500 mL

## 2022-06-20 MED ORDER — CLOPIDOGREL BISULFATE 75 MG PO TABS
75.0000 mg | ORAL_TABLET | Freq: Every day | ORAL | Status: DC
  Administered 2022-06-21: 75 mg via ORAL
  Filled 2022-06-20: qty 1

## 2022-06-20 SURGICAL SUPPLY — 12 items
BAND ZEPHYR COMPRESS 30 LONG (HEMOSTASIS) IMPLANT
CATH INFINITI JR4 5F (CATHETERS) IMPLANT
CATH OPTITORQUE TIG 4.0 5F (CATHETERS) IMPLANT
GLIDESHEATH SLEND A-KIT 6F 22G (SHEATH) IMPLANT
GUIDEWIRE INQWIRE 1.5J.035X260 (WIRE) IMPLANT
INQWIRE 1.5J .035X260CM (WIRE) ×1
KIT HEART LEFT (KITS) ×1 IMPLANT
PACK CARDIAC CATHETERIZATION (CUSTOM PROCEDURE TRAY) ×1 IMPLANT
SYR MEDRAD MARK 7 150ML (SYRINGE) ×1 IMPLANT
TRANSDUCER W/STOPCOCK (MISCELLANEOUS) ×1 IMPLANT
TUBING CIL FLEX 10 FLL-RA (TUBING) ×1 IMPLANT
WIRE HI TORQ VERSACORE-J 145CM (WIRE) IMPLANT

## 2022-06-20 NOTE — Progress Notes (Signed)
ANTICOAGULATION CONSULT NOTE - Follow Up Consult  Pharmacy Consult for heparin Indication:  NSTEMI  Labs: Recent Labs    06/19/22 1411 06/19/22 1706 06/19/22 2353  HGB 15.1  --   --   HCT 46.4  --   --   PLT 270  --   --   HEPARINUNFRC  --   --  <0.10*  CREATININE 0.76  --   --   TROPONINIHS 66* 572* 445*    Assessment: 52yo male subtherapeutic on heparin with initial dosing for NSTEMI; no signs of bleeding per RN though heparin was charted as stopped at 2020 while at [facility] and current RN turned it back on when pt was transferred to [facility], probably off ~1.5h.  Goal of Therapy:  Heparin level 0.3-0.7 units/ml   Plan:  3000 units heparin bolus. Increase heparin infusion by 2-3 units/kg/hr to 1600 units/hr. Check level in 6 hours.   Vernard Gambles, PharmD, BCPS 06/20/2022 1:15 AM

## 2022-06-20 NOTE — Progress Notes (Signed)
Patient had  a five beat run of VTach. Patient states he did not  feel anything. Denies palpitation, chest pain or SHOB. Patient was at rest when this occurred.

## 2022-06-20 NOTE — Interval H&P Note (Signed)
Cath Lab Visit (complete for each Cath Lab visit)  Clinical Evaluation Leading to the Procedure:   ACS: Yes.    Non-ACS:    Anginal Classification: CCS II  Anti-ischemic medical therapy: Minimal Therapy (1 class of medications)  Non-Invasive Test Results: No non-invasive testing performed  Prior CABG: No previous CABG      History and Physical Interval Note:  06/20/2022 7:42 AM  Jorge Green  has presented today for surgery, with the diagnosis of NSTEMI.  The various methods of treatment have been discussed with the patient and family. After consideration of risks, benefits and other options for treatment, the patient has consented to  Procedure(s): LEFT HEART CATH AND CORONARY ANGIOGRAPHY (N/A) as a surgical intervention.  The patient's history has been reviewed, patient examined, no change in status, stable for surgery.  I have reviewed the patient's chart and labs.  Questions were answered to the patient's satisfaction.     Nanetta Batty

## 2022-06-20 NOTE — TOC Benefit Eligibility Note (Signed)
Patient Product/process development scientist completed.    The patient is currently admitted and upon discharge could be taking Vascepa 1 g.  Requires Prior Authorization  The patient is currently admitted and upon discharge could be taking icosapent Ethyl (Vascepa) 1 g.  Product Not on Formulary  The patient is insured through [employer]   This test claim was processed through [facility]- copay amounts may vary at other pharmacies due to pharmacy/plan contracts, or as the patient moves through the different stages of their insurance plan.  Roland Earl, CPHT Pharmacy Patient Advocate Specialist [facility] Patient Advocate Team Direct Number: [phone]  Fax: [phone]

## 2022-06-20 NOTE — Telephone Encounter (Signed)
Patient Advocate Encounter  Prior Authorization for Vascepa 1GM capsules has been approved.    PA# [id number] Key: [id number] Insurance [employer] Electronic PA Form   Patients co-pay is $5.00.     Roland Earl, CPhT Pharmacy Patient Advocate Specialist [facility] Patient Advocate Team Direct Number: [phone]  Fax: [phone]

## 2022-06-20 NOTE — Progress Notes (Addendum)
CARDIAC REHAB PHASE I   PRE:  Rate/Rhythm: 86 NSR  BP:  Sitting: 117/99      SaO2: 96 RA  MODE:  Ambulation: 740 ft   AD:   no AD  POST:  Rate/Rhythm: 103 ST  BP:  Sitting: 136/100      SaO2: 96 RA  Pt amb with standby assistance, pt denies CP and SOB during amb and was returned to room w/o complaint.   Began education with pt, Pt was educated on restrictions, importance of exercise and diet control. Pt received MI book. Pt family brought in fast food during education.   Referral placed to: [facility] under cardiologist Dr. Lennie Odor  Qualifying Dx:     Jorge Green  ACSM-CEP 1:38 PM 06/20/2022    Service time is from 1300 to 1338.

## 2022-06-20 NOTE — Progress Notes (Signed)
Rounding Note    Patient Name: Jorge Green Date of Encounter: 06/20/2022  [facility] Cardiologist: Reatha Harps, MD   Subjective   Feels well this am. No chest pain or dyspnea. Never had chest pain prior to yesterday. 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, retail at [employer]  Inpatient Medications    Scheduled Meds:  aspirin  324 mg Oral NOW   Or   aspirin  300 mg Rectal NOW   [START ON 06/21/2022] aspirin EC  81 mg Oral Daily   atorvastatin  80 mg Oral Daily   carvedilol  6.25 mg Oral BID WC   Chlorhexidine Gluconate Cloth  6 each Topical Daily   empagliflozin  10 mg Oral Daily   insulin aspart  0-5 Units Subcutaneous QHS   insulin aspart  0-9 Units Subcutaneous TID WC   insulin detemir  20 Units Subcutaneous QPM   losartan  100 mg Oral Daily   NIFEdipine  90 mg Oral Daily   nitroGLYCERIN       sodium chloride flush  3 mL Intravenous Q12H   sodium chloride flush  3 mL Intravenous Q12H   spironolactone  25 mg Oral Daily   Continuous Infusions:  sodium chloride 105 mL/hr at 06/20/22 0700   sodium chloride 75 mL/hr at 06/20/22 0900   sodium chloride     nitroGLYCERIN 60 mcg/min (06/20/22 0900)   PRN Meds: sodium chloride, acetaminophen, hydrALAZINE, labetalol, morphine injection, nitroGLYCERIN, nitroGLYCERIN, ondansetron (ZOFRAN) IV, mouth rinse, sodium chloride flush, zolpidem   Vital Signs    Vitals:   06/20/22 0818 06/20/22 0845 06/20/22 0900 06/20/22 0915  BP: (!) 133/92 (!) 157/84 (!) 133/90 135/89  Pulse: (!) 0 85 80 79  Resp:  18 17 (!) 21  Temp:  98.3 F (36.8 C)    TempSrc:  Oral    SpO2: 91% 94% 94% 97%  Weight:      Height:        Intake/Output Summary (Last 24 hours) at 06/20/2022 1003 Last data filed at 06/20/2022 0900 Gross per 24 hour  Intake 1726.94 ml  Output 280 ml  Net 1446.94 ml      06/20/2022    3:55 AM 06/19/2022   10:23 PM 06/19/2022    1:57 PM  Last 3 Weights  Weight (lbs) 229 lb 0.9 oz 229 lb 0.9 oz 231 lb   Weight (kg) 103.9 kg 103.9 kg 104.781 kg      Telemetry    NSR - Personally Reviewed  ECG    NSR, LAFB, 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   Neck: No JVD Cardiac: RRR, no murmurs, rubs, or gallops.  Respiratory: Clear to auscultation bilaterally. GI: Soft, nontender, non-distended  MS: No edema; No deformity. TR band in place right wrist Neuro:  Nonfocal  Psych: Normal affect   Labs    High Sensitivity Troponin:   Recent Labs  Lab 06/19/22 1411 06/19/22 1706 06/19/22 2353 06/20/22 0630  TROPONINIHS 66* 572* 445* 3,154*     Chemistry Recent Labs  Lab 06/19/22 1411 06/19/22 2353  NA 137  --   K 3.8  --   CL 106  --   CO2 21*  --   GLUCOSE 158*  --   BUN 13  --   CREATININE 0.76  --   CALCIUM 8.6*  --   MG  --  2.0  PROT  --  6.2*  ALBUMIN  --  3.5  AST  --  27  ALT  --  21  ALKPHOS  --  60  BILITOT  --  0.4  GFRNONAA >60  --   ANIONGAP 10  --     Lipids  Recent Labs  Lab 06/19/22 2353  CHOL 116  TRIG 240*  HDL 24*  LDLCALC 44  CHOLHDL 4.8    Hematology Recent Labs  Lab 06/19/22 1411 06/19/22 2353  WBC 6.1 6.3  RBC 5.45 5.20  HGB 15.1 14.3  HCT 46.4 43.7  MCV 85.1 84.0  MCH 27.7 27.5  MCHC 32.5 32.7  RDW 13.2 13.2  PLT 270 270   Thyroid  Recent Labs  Lab 06/19/22 2353  TSH 2.592  FREET4 0.95    BNPNo results for input(s): "BNP", "PROBNP" in the last 168 hours.  DDimer No results for input(s): "DDIMER" in the last 168 hours.   Radiology    CARDIAC CATHETERIZATION  Result Date: 06/20/2022 Images from the original result were not included.   Prox RCA to Mid RCA lesion is 30% stenosed.   3rd RPL lesion is 100% stenosed.   3rd Mrg lesion is 100% stenosed.   1st Mrg lesion is 100% stenosed.   Lat 1st Mrg lesion is 80% stenosed.   The left ventricular systolic function is normal.   LV end diastolic pressure is mildly elevated.   The left ventricular ejection fraction is 55-65% by visual  estimate. Jorge Green is a 52 y.o. male  [id number] LOCATION:  FACILITY: [facility] PHYSICIAN: Nanetta Batty, M.D. [birth year] DATE OF PROCEDURE:  06/20/2022 DATE OF DISCHARGE: CARDIAC CATHETERIZATION History obtained from chart review.Jorge Green is a 52 y.o. male with HTN, DM, Coronary calcifications, tobacco abuse, OSA, HLD on statin  who is being seen 06/19/2022 for the evaluation of chest pain.  He ended up having a non-STEMI with a peak troponin in the 3000 range.  His EKG showed no acute changes.  He has been on IV heparin and nitroglycerin in unit to which.  He presents now for diagnostic coronary angiography.   Mr. Pretorius was admitted with a non-STEMI.  He has coronary anatomy consistent with "diabetic vessels" with an occluded distal right PL 3, distal left PL branch and an occluded distal first marginal branch with highly diseased inferolateral branch of this.  His LAD and diagonal vessels had minor irregularities.  LV function was normal.  I do not think that there were any PCI targets.  Recommend aggressive medical therapy and risk factor modification.  We talked about smoking cessation and getting his diabetes under better control.  The sheath was removed and a TR band was placed on the right wrist to achieve patent hemostasis.  The patient left lab in stable condition. Nanetta Batty. MD, [facility] 06/20/2022 8:32 AM    DG Chest 2 View  Result Date: 06/19/2022 CLINICAL DATA:  Chest pressure EXAM: CHEST - 2 VIEW COMPARISON:  04/29/2017 FINDINGS: Cardiac and mediastinal contours are within normal limits. No focal pulmonary opacity. No pleural effusion or pneumothorax. No acute osseous abnormality. IMPRESSION: No acute cardiopulmonary process. Electronically Signed   By: Wiliam Ke M.D.   On: 06/19/2022 14:41    Cardiac Studies   LEFT HEART CATH AND CORONARY ANGIOGRAPHY   Conclusion      Prox RCA to Mid RCA lesion is 30% stenosed.   3rd RPL lesion is 100% stenosed.   3rd Mrg lesion is 100%  stenosed.   1st Mrg lesion is 100% stenosed.  Lat 1st Mrg lesion is 80% stenosed.   The left ventricular systolic function is normal.   LV end diastolic pressure is mildly elevated.   The left ventricular ejection fraction is 55-65% by visual estimate.   Coronary Diagrams  Diagnostic Dominance: Right  Intervention    Patient Profile     52 y.o. male with history of tobacco abuse, DM on insulin, HTN, HLD presents with NSTEMI  Assessment & Plan    NSTEMI with chest pain and hx coronary calcifications on CT of chest.  He is diabetic and with HLD.  He uses tobacco.   + troponin- peak 3154.  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 PL of RCA occluded as are 2 distal OM branches. Diabetic appearing vessels.  His BP was quite elevated- now improved. LV function appears good. Echo pending. Was on Coreg and nifedipine PTA. Will add Imdur. Stop IV Ntg. Observe with increase activity with cardiac Rehab. Potential DC tomorrow if stable. Will add Plavix for ACS indication.  HTN poorly controlled on admit. Now improved. On multiple antihypertensives including Coreg, lisinopril HCT, nifedipine and aldactone. Low sodium diet.   HLD continue statin. LDL 44. Consider adding Vascepa for elevated triglycerides.  OSA with severe hypoxemia has had sleep study but not heard back so has not had titration yet. DM on insulin - will hold metformin add SSI lower dose of levemir and diabetes coordinator consult  Tobacco abuse - counseled on smoking cessation.     For questions or updates, please contact [facility] Please consult [email or web address] for contact info under        Signed, Urian Martenson [place], MD  06/20/2022, 10:03 AM

## 2022-06-21 ENCOUNTER — Encounter (HOSPITAL_COMMUNITY): Payer: Self-pay | Admitting: Cardiovascular Disease

## 2022-06-21 ENCOUNTER — Other Ambulatory Visit (HOSPITAL_COMMUNITY): Payer: Self-pay

## 2022-06-21 DIAGNOSIS — I10 Essential (primary) hypertension: Secondary | ICD-10-CM | POA: Insufficient documentation

## 2022-06-21 DIAGNOSIS — I214 Non-ST elevation (NSTEMI) myocardial infarction: Secondary | ICD-10-CM | POA: Diagnosis not present

## 2022-06-21 DIAGNOSIS — E118 Type 2 diabetes mellitus with unspecified complications: Secondary | ICD-10-CM

## 2022-06-21 DIAGNOSIS — E785 Hyperlipidemia, unspecified: Secondary | ICD-10-CM | POA: Insufficient documentation

## 2022-06-21 DIAGNOSIS — Z720 Tobacco use: Secondary | ICD-10-CM | POA: Insufficient documentation

## 2022-06-21 DIAGNOSIS — I2510 Atherosclerotic heart disease of native coronary artery without angina pectoris: Secondary | ICD-10-CM | POA: Insufficient documentation

## 2022-06-21 LAB — BASIC METABOLIC PANEL
Anion gap: 10 (ref 5–15)
BUN: 9 mg/dL (ref 6–20)
CO2: 20 mmol/L — ABNORMAL LOW (ref 22–32)
Calcium: 8.8 mg/dL — ABNORMAL LOW (ref 8.9–10.3)
Chloride: 104 mmol/L (ref 98–111)
Creatinine, Ser: 0.84 mg/dL (ref 0.61–1.24)
GFR, Estimated: >60 mL/min (ref >60)
Glucose, Bld: 118 mg/dL — ABNORMAL HIGH (ref 70–99)
Potassium: 3.8 mmol/L (ref 3.5–5.1)
Sodium: 134 mmol/L — ABNORMAL LOW (ref 135–145)

## 2022-06-21 LAB — CBC
HCT: 46 % (ref 39.0–52.0)
Hemoglobin: 15.2 g/dL (ref 13.0–17.0)
MCH: 27.7 pg (ref 26.0–34.0)
MCHC: 33 g/dL (ref 30.0–36.0)
MCV: 83.9 fL (ref 80.0–100.0)
Platelets: 288 10*3/uL (ref 150–400)
RBC: 5.48 MIL/uL (ref 4.22–5.81)
RDW: 13.1 % (ref 11.5–15.5)
WBC: 9.3 10*3/uL (ref 4.0–10.5)
nRBC: 0 % (ref 0.0–0.2)

## 2022-06-21 LAB — GLUCOSE, CAPILLARY: Glucose-Capillary: 165 mg/dL — ABNORMAL HIGH (ref 70–99)

## 2022-06-21 LAB — LIPOPROTEIN A (LPA): Lipoprotein (a): 55.5 nmol/L — ABNORMAL HIGH (ref <75.0)

## 2022-06-21 MED ORDER — NICOTINE 14 MG/24HR TD PT24
14.0000 mg | MEDICATED_PATCH | Freq: Every day | TRANSDERMAL | Status: DC
  Administered 2022-06-21: 14 mg via TRANSDERMAL
  Filled 2022-06-21: qty 1

## 2022-06-21 MED ORDER — ATORVASTATIN CALCIUM 40 MG PO TABS
40.0000 mg | ORAL_TABLET | Freq: Every day | ORAL | Status: DC
  Administered 2022-06-21: 40 mg via ORAL
  Filled 2022-06-21: qty 1

## 2022-06-21 MED ORDER — NICOTINE 14 MG/24HR TD PT24
14.0000 mg | MEDICATED_PATCH | Freq: Every day | TRANSDERMAL | 0 refills | Status: AC
  Filled 2022-06-21: qty 28, 28d supply, fill #0

## 2022-06-21 MED ORDER — NITROGLYCERIN 0.4 MG SL SUBL
0.4000 mg | SUBLINGUAL_TABLET | SUBLINGUAL | 2 refills | Status: AC | PRN
  Filled 2022-06-21: qty 25, 7d supply, fill #0

## 2022-06-21 MED ORDER — IRBESARTAN 150 MG PO TABS
150.0000 mg | ORAL_TABLET | Freq: Every day | ORAL | Status: DC
  Administered 2022-06-21: 150 mg via ORAL
  Filled 2022-06-21: qty 1

## 2022-06-21 MED ORDER — CLOPIDOGREL BISULFATE 75 MG PO TABS
75.0000 mg | ORAL_TABLET | Freq: Every day | ORAL | 11 refills | Status: DC
  Filled 2022-06-21: qty 30, 30d supply, fill #0

## 2022-06-21 MED ORDER — ISOSORBIDE MONONITRATE ER 30 MG PO TB24
30.0000 mg | ORAL_TABLET | Freq: Every day | ORAL | 2 refills | Status: DC
  Filled 2022-06-21: qty 30, 30d supply, fill #0

## 2022-06-21 MED ORDER — IRBESARTAN 150 MG PO TABS
150.0000 mg | ORAL_TABLET | Freq: Every day | ORAL | 2 refills | Status: DC
  Filled 2022-06-21: qty 30, 30d supply, fill #0

## 2022-06-21 MED ORDER — ICOSAPENT ETHYL 1 G PO CAPS
2.0000 g | ORAL_CAPSULE | Freq: Two times a day (BID) | ORAL | 2 refills | Status: DC
  Filled 2022-06-21: qty 120, 30d supply, fill #0

## 2022-06-21 MED ORDER — CARVEDILOL 12.5 MG PO TABS
12.5000 mg | ORAL_TABLET | Freq: Two times a day (BID) | ORAL | Status: DC
  Filled 2022-06-21: qty 1

## 2022-06-21 MED ORDER — CARVEDILOL 12.5 MG PO TABS
12.5000 mg | ORAL_TABLET | Freq: Two times a day (BID) | ORAL | 2 refills | Status: DC
  Filled 2022-06-21: qty 60, 30d supply, fill #0

## 2022-06-21 MED ORDER — ATORVASTATIN CALCIUM 40 MG PO TABS
40.0000 mg | ORAL_TABLET | Freq: Every day | ORAL | 11 refills | Status: DC
  Filled 2022-06-21: qty 30, 30d supply, fill #0

## 2022-06-21 MED ORDER — POTASSIUM CHLORIDE CRYS ER 20 MEQ PO TBCR
20.0000 meq | EXTENDED_RELEASE_TABLET | Freq: Once | ORAL | Status: AC
  Administered 2022-06-21: 20 meq via ORAL
  Filled 2022-06-21: qty 1

## 2022-06-21 NOTE — Discharge Instructions (Addendum)
Medication Changes: - START Plavix 75mg  daily in addition to the Aspirin 81mg  daily. - START Imdur 30mg  daily. - START Carvedilol (Coreg) 12.5mg  twice daily. - STOP Losartan and START Irbesartan 150mg  daily. - START Vascepa 2g twice daily. - INCREASE Lipitor to 40mg  daily. - CONTINUE Nefidipine 90mg  daily. - CONTINUE Spironolactone 25mg  daily.   - Recommend stopping Excedrin and Ibuprofen. Recommended using over the counter Acetaminophen (Tylenol) as needed for pain/ headaches. - We also stopped your potassium supplement because your potassium level has been normal this admission without it. _______________  Post NSTEMI: NO HEAVY LIFTING X 2 WEEKS. NO SEXUAL ACTIVITY X 2 WEEKS. NO DRIVING X 3-5 DAYS. NO SOAKING BATHS, HOT TUBS, POOLS, ETC., X 7 DAYS. _______________  Radial Site Care: Refer to this sheet in the next few weeks. These instructions provide you with information on caring for yourself after your procedure. Your caregiver may also give you more specific instructions. Your treatment has been planned according to current medical practices, but problems sometimes occur. Call your caregiver if you have any problems or questions after your procedure. HOME CARE INSTRUCTIONS You may shower the day after the procedure. Remove the bandage (dressing) and gently wash the site with plain soap and water. Gently pat the site dry.  Do not apply powder or lotion to the site.  Do not submerge the affected site in water for 3 to 5 days.  Inspect the site at least twice daily.  Do not flex or bend the affected arm for 24 hours.  No lifting over 5 pounds (2.3 kg) for 5 days after your procedure.  Do not drive home if you are discharged the same day of the procedure. Have someone else drive you.  What to expect: Any bruising will usually fade within 1 to 2 weeks.  Blood that collects in the tissue (hematoma) may be painful to the touch. It should usually decrease in size and tenderness within  1 to 2 weeks.  SEEK IMMEDIATE MEDICAL CARE IF: You have unusual pain at the radial site.  You have redness, warmth, swelling, or pain at the radial site.  You have drainage (other than a small amount of blood on the dressing).  You have chills.  You have a fever or persistent symptoms for more than 72 hours.  You have a fever and your symptoms suddenly get worse.  Your arm becomes pale, cool, tingly, or numb.  You have heavy bleeding from the site. Hold pressure on the site.     Information about your medication: Plavix (anti-platelet agent)  Generic Name (Brand): clopidogrel (Plavix), once daily medication  PURPOSE: You are taking this medication along with aspirin to lower your chance of having a heart attack, stroke, or blood clots in your heart stent. These can be fatal. Plavix and aspirin help prevent platelets from sticking together and forming a clot that can block an artery or your stent.   Common SIDE EFFECTS you may experience include: bruising or bleeding more easily, shortness of breath  Do not stop taking PLAVIX without talking to the doctor who prescribes it for you. People who are treated with a stent and stop taking Plavix too soon, have a higher risk of getting a blood clot in the stent, having a heart attack, or dying. If you stop Plavix because of bleeding, or for other reasons, your risk of a heart attack or stroke may increase.   Avoid taking NSAID agents or anti-inflammatory medications such as ibuprofen, naproxen given increased  bleed risk with plavix - can use acetaminophen (Tylenol) if needed for pain.  Avoid taking over the counter stomach medications omeprazole (Prilosec) or esomeprazole (Nexium) since these do interact and make plavix less effective - ask your pharmacist or doctor for alterative agents if needed for heartburn or GERD.   Tell all of your doctors and dentists that you are taking Plavix. They should talk to the doctor who prescribed Plavix for you  before you have any surgery or invasive procedure.   Contact your health care provider if you experience: severe or uncontrollable bleeding, pink/red/brown urine, vomiting blood or vomit that looks like "coffee grounds", red or black stools (looks like tar), coughing up blood or blood clots ----------------------------------------------------------------------------------------------------------------------

## 2022-06-21 NOTE — Plan of Care (Signed)
  Problem: Education: Goal: Understanding of cardiac disease, CV risk reduction, and recovery process will improve Outcome: Progressing Goal: Individualized Educational Video(s) Outcome: Progressing   Problem: Activity: Goal: Ability to tolerate increased activity will improve Outcome: Progressing   Problem: Cardiac: Goal: Ability to achieve and maintain adequate cardiovascular perfusion will improve Outcome: Progressing   Problem: Health Behavior/Discharge Planning: Goal: Ability to safely manage health-related needs after discharge will improve Outcome: Progressing   Problem: Education: Goal: Ability to describe self-care measures that may prevent or decrease complications (Diabetes Survival Skills Education) will improve Outcome: Progressing Goal: Individualized Educational Video(s) Outcome: Progressing   Problem: Coping: Goal: Ability to adjust to condition or change in health will improve Outcome: Progressing   Problem: Fluid Volume: Goal: Ability to maintain a balanced intake and output will improve Outcome: Progressing   Problem: Health Behavior/Discharge Planning: Goal: Ability to identify and utilize available resources and services will improve Outcome: Progressing Goal: Ability to manage health-related needs will improve Outcome: Progressing   Problem: Metabolic: Goal: Ability to maintain appropriate glucose levels will improve Outcome: Progressing   Problem: Nutritional: Goal: Maintenance of adequate nutrition will improve Outcome: Progressing Goal: Progress toward achieving an optimal weight will improve Outcome: Progressing   Problem: Skin Integrity: Goal: Risk for impaired skin integrity will decrease Outcome: Progressing   Problem: Tissue Perfusion: Goal: Adequacy of tissue perfusion will improve Outcome: Progressing   Problem: Education: Goal: Understanding of CV disease, CV risk reduction, and recovery process will improve Outcome:  Progressing Goal: Individualized Educational Video(s) Outcome: Progressing   Problem: Activity: Goal: Ability to return to baseline activity level will improve Outcome: Progressing   Problem: Cardiovascular: Goal: Ability to achieve and maintain adequate cardiovascular perfusion will improve Outcome: Progressing Goal: Vascular access site(s) Level 0-1 will be maintained Outcome: Progressing   Problem: Health Behavior/Discharge Planning: Goal: Ability to safely manage health-related needs after discharge will improve Outcome: Progressing   Problem: Education: Goal: Knowledge of General Education information will improve Description: Including pain rating scale, medication(s)/side effects and non-pharmacologic comfort measures Outcome: Progressing   Problem: Health Behavior/Discharge Planning: Goal: Ability to manage health-related needs will improve Outcome: Progressing   Problem: Clinical Measurements: Goal: Ability to maintain clinical measurements within normal limits will improve Outcome: Progressing Goal: Will remain free from infection Outcome: Progressing Goal: Diagnostic test results will improve Outcome: Progressing Goal: Respiratory complications will improve Outcome: Progressing Goal: Cardiovascular complication will be avoided Outcome: Progressing   Problem: Activity: Goal: Risk for activity intolerance will decrease Outcome: Progressing   Problem: Nutrition: Goal: Adequate nutrition will be maintained Outcome: Progressing   Problem: Coping: Goal: Level of anxiety will decrease Outcome: Progressing   Problem: Elimination: Goal: Will not experience complications related to bowel motility Outcome: Progressing Goal: Will not experience complications related to urinary retention Outcome: Progressing   Problem: Pain Managment: Goal: General experience of comfort will improve Outcome: Progressing   Problem: Safety: Goal: Ability to remain free from  injury will improve Outcome: Progressing   Problem: Skin Integrity: Goal: Risk for impaired skin integrity will decrease Outcome: Progressing   

## 2022-06-21 NOTE — Discharge Summary (Addendum)
Discharge Summary    Patient ID: Jorge Green MRN: [id number]; DOB: Aug 22, 1970  Admit date: 06/19/2022 Discharge date: 06/21/2022  PCP:  Carilyn Goodpasture, NP   [facility] Providers Cardiologist:  Reatha Harps, MD   {  Discharge Diagnoses    Principal Problem:   NSTEMI (non-ST elevated myocardial infarction) [facility]) Active Problems:   CAD (coronary artery disease)   Hypertension   Hyperlipidemia   Type 2 diabetes mellitus with complication, with long-term current use of insulin (HCC)   Tobacco abuse    Diagnostic Studies/Procedures    Left Cardiac Catheterization 06/20/2022:   Prox RCA to Mid RCA lesion is 30% stenosed.   3rd RPL lesion is 100% stenosed.   3rd Mrg lesion is 100% stenosed.   1st Mrg lesion is 100% stenosed.   Lat 1st Mrg lesion is 80% stenosed.   The left ventricular systolic function is normal.   LV end diastolic pressure is mildly elevated.   The left ventricular ejection fraction is 55-65% by visual estimate.  Impressions: Mr. Geib was admitted with a non-STEMI. He has coronary anatomy consistent with "diabetic vessels" with an occluded distal right PL 3, distal left PL branch and an occluded distal first marginal branch with highly diseased inferolateral branch of this. His LAD and diagonal vessels had minor irregularities. LV function was normal. I do not think that there were any PCI targets. Recommend aggressive medical therapy and risk factor modification. We talked about smoking cessation and getting his diabetes under better control. The sheath was removed and a TR band was placed on the right wrist to achieve patent hemostasis. The patient left lab in stable condition.   Diagnostic Dominance: Right     _____________   Echocardiogram 06/20/2022: Impressions: 1. Left ventricular ejection fraction, by estimation, is 55 to 60%. The  left ventricle has normal function. The left ventricle has no regional  wall motion abnormalities.  There is moderate concentric left ventricular  hypertrophy. Left ventricular  diastolic parameters are consistent with Grade I diastolic dysfunction  (impaired relaxation).   2. Right ventricular systolic function is normal. The right ventricular  size is normal. Tricuspid regurgitation signal is inadequate for assessing  PA pressure.   3. The mitral valve is grossly normal. Trivial mitral valve  regurgitation. No evidence of mitral stenosis.   4. The aortic valve is tricuspid. Aortic valve regurgitation is not  visualized. Aortic valve sclerosis is present, with no evidence of aortic  valve stenosis.   5. The inferior vena cava is normal in size with greater than 50%  respiratory variability, suggesting right atrial pressure of 3 mmHg.   History of Present Illness     Jorge Green is a 52 y.o. male with a history of coronary artery calcifications, hypertension, hyperlipidemia, type 2 diabetes mellitus, obstructive sleep apnea, and tobacco abuse who was admitted on 06/19/2022 for NSTEMI after presenting with chest pain. EKG showed normal sinus rhythm with no acute ST/T changes. She was started on IV Heparin and IV Nitroglycerin admitted for further management.   Hospital Course     Consultants: None   NSTEMI Patient presented with chest pain and ruled in for NSTEMI. High-sensitivity troponin peaked at 3,154. LHC showed 30% stenosis of proximal to mid RCA, 100% stenosis of 3rd RPL, 100% stenosis of OM1 and OM3, and 80% stenosis of 1st lateral marginal vessel. Anatomy consistent with diabetic vessels. Medical thearpy was recommended given no PCI targets.  Echo showed LVEF of 55-60% with  normal wall motion, moderate LVH, and grade 1 diastolic dysfunction. He is doing well on morning of discharge. He denies any recurrent chest pain and has been able to ambulate with Cardiac Rehab without any issues. He was started on DAPT with Asprin and Plavix and plan will be to continue this for 12 months  given ACS.  He was started on Coreg and Imdur and home Lipitor was increased.   Non-Sustained VT Patient had a very short run of NSVT of about 5 beats overnight. Potassium 3.8. Patient was started on Coreg earlier this admission. Heart rates in the 80s to 90s so will increase this to 12.5mg  twice daily at discharge.  Hypertension BP markedly elevated on admission in the 180s/110s. Home medications: Nifedipine 90mg  daily, Losartan 100mg  daily, and Spironolactone 25mg  daily. Medication were adjusted throughout admission. Losartan was switched to Irbesartan 150mg  daily and he was started on Coreg 12.5mg  twice daily. Continue home doses of Nifedipine and Spironolactone.   Hyperlipidemia Lipid panel this admission: Total Cholesterol 116, Triglycerides 240, HDL 24, LDL 44. He was started on Vascepa 2g twice daily and home Lipitor was increased from 20mg  to 40mg  daily in favor of high-intensity statin in setting of ACS. Will need repeat lipid panel and LFTs in 6-8 weeks.   Type 2 Diabetes Mellitus Hemoglobin A1c 7.6%. Metformin held on admission and patient was started on sliding scale insulin. Can resume home regimen at discharge which includes Metformin, Jardiance, and Insulin (Levemir). Can restart Metformin 48 hours after cardiac catheterization (06/23/2022).   Tobacco Abuse Patient was counseled on the importance of complete cessation. Will provide Nicotine patches at discharge per patient's request.  Patient seen and examined by Dr. [place] today and determined to be stable for discharge. Medications as above. Outpatient follow-up arranged.  He works as a Technical sales engineer at [employer]. He should stay out of work until he can be seen in follow-up next week. Work note provided.   Did the patient have an acute coronary syndrome (MI, NSTEMI, STEMI, etc) this admission?:  Yes                               AHA/ACC Clinical Performance & Quality Measures: Aspirin prescribed? - Yes ADP Receptor Inhibitor  (Plavix/Clopidogrel, Brilinta/Ticagrelor or Effient/Prasugrel) prescribed (includes medically managed patients)? - Yes Beta Blocker prescribed? - Yes High Intensity Statin (Lipitor 40-80mg  or Crestor 20-40mg ) prescribed? - Yes EF assessed during THIS hospitalization? - Yes For EF <40%, was ACEI/ARB prescribed? - Yes For EF <40%, Aldosterone Antagonist (Spironolactone or Eplerenone) prescribed? - Yes Cardiac Rehab Phase II ordered (including medically managed patients)? - Yes   _____________  Discharge Vitals Blood pressure (!) 155/108, pulse 90, temperature 99 F (37.2 C), temperature source Oral, resp. rate 18, height 5\' 11"  (1.803 m), weight 102.1 kg, SpO2 92 %.  Filed Weights   06/19/22 2223 06/20/22 0355 06/21/22 0521  Weight: 103.9 kg 103.9 kg 102.1 kg   General: 52 y.o. male resting comfortably in no acute distress. HEENT: Normocephalic and atraumatic. Sclera clear.  Neck: Supple. No JVD. Heart: RRR. Distinct S1 and S2. No murmurs, gallops, or rubs. Radial pulses 2+ and equal bilaterally. Right radial cath site soft and stable with no signs of hematoma. Lungs: No increased work of breathing. Clear to ausculation bilaterally. No wheezes, rhonchi, or rales.  Abdomen: Soft, non-distended, and non-tender to palpation.  Extremities: No lower extremity edema.     Skin: Warm and dry.  Neuro: Alert and oriented x3. No focal deficits. Psych: Normal affect. Responds appropriately.  Labs & Radiologic Studies    CBC Recent Labs    06/19/22 2353 06/21/22 0118  WBC 6.3 9.3  HGB 14.3 15.2  HCT 43.7 46.0  MCV 84.0 83.9  PLT 270 288   Basic Metabolic Panel Recent Labs    16/10/96 1411 06/19/22 2353 06/21/22 0118  NA 137  --  134*  K 3.8  --  3.8  CL 106  --  104  CO2 21*  --  20*  GLUCOSE 158*  --  118*  BUN 13  --  9  CREATININE 0.76  --  0.84  CALCIUM 8.6*  --  8.8*  MG  --  2.0  --    Liver Function Tests Recent Labs    06/19/22 2353  AST 27  ALT 21  ALKPHOS 60   BILITOT 0.4  PROT 6.2*  ALBUMIN 3.5   No results for input(s): "LIPASE", "AMYLASE" in the last 72 hours. High Sensitivity Troponin:   Recent Labs  Lab 06/19/22 1411 06/19/22 1706 06/19/22 2353 06/20/22 0630  TROPONINIHS 66* 572* 445* 3,154*    BNP Invalid input(s): "POCBNP" D-Dimer No results for input(s): "DDIMER" in the last 72 hours. Hemoglobin A1C Recent Labs    06/19/22 2353  HGBA1C 7.6*   Fasting Lipid Panel Recent Labs    06/19/22 2353  CHOL 116  HDL 24*  LDLCALC 44  TRIG 045*  CHOLHDL 4.8   Thyroid Function Tests Recent Labs    06/19/22 2353  TSH 2.592   _____________  ECHOCARDIOGRAM COMPLETE  Result Date: 06/20/2022    ECHOCARDIOGRAM REPORT   Patient Name:   Jorge Green Date of Exam: 06/20/2022 Medical Rec #:  [id number]        Height:       71.0 in Accession #:    [id number]       Weight:       229.1 lb Date of Birth:  Dec 27, 1970        BSA:          2.234 m Patient Age:    52 years         BP:           135/89 mmHg Patient Gender: M                HR:           84 bpm. Exam Location:  Inpatient Procedure: 2D Echo, 3D Echo, Cardiac Doppler, Color Doppler and Strain Analysis Indications:    NSTEMI I21.4  History:        Patient has no prior history of Echocardiogram examinations.                 Previous Myocardial Infarction; Risk Factors:Sleep Apnea and                 Current Smoker.  Sonographer:    Dondra Prader RVT RCS Referring Phys: [id number] LAURA R INGOLD  Sonographer Comments: Global longitudinal strain was attempted. IMPRESSIONS  1. Left ventricular ejection fraction, by estimation, is 55 to 60%. The left ventricle has normal function. The left ventricle has no regional wall motion abnormalities. There is moderate concentric left ventricular hypertrophy. Left ventricular diastolic parameters are consistent with Grade I diastolic dysfunction (impaired relaxation).  2. Right ventricular systolic function is normal. The right ventricular size is normal.  Tricuspid regurgitation signal is inadequate for assessing PA pressure.  3. The mitral valve is grossly normal. Trivial mitral valve regurgitation. No evidence of mitral stenosis.  4. The aortic valve is tricuspid. Aortic valve regurgitation is not visualized. Aortic valve sclerosis is present, with no evidence of aortic valve stenosis.  5. The inferior vena cava is normal in size with greater than 50% respiratory variability, suggesting right atrial pressure of 3 mmHg. FINDINGS  Left Ventricle: Left ventricular ejection fraction, by estimation, is 55 to 60%. The left ventricle has normal function. The left ventricle has no regional wall motion abnormalities. The left ventricular internal cavity size was normal in size. There is  moderate concentric left ventricular hypertrophy. Left ventricular diastolic parameters are consistent with Grade I diastolic dysfunction (impaired relaxation). Right Ventricle: The right ventricular size is normal. No increase in right ventricular wall thickness. Right ventricular systolic function is normal. Tricuspid regurgitation signal is inadequate for assessing PA pressure. Left Atrium: Left atrial size was normal in size. Right Atrium: Right atrial size was normal in size. Pericardium: Trivial pericardial effusion is present. Presence of epicardial fat layer. Mitral Valve: The mitral valve is grossly normal. Trivial mitral valve regurgitation. No evidence of mitral valve stenosis. Tricuspid Valve: The tricuspid valve is grossly normal. Tricuspid valve regurgitation is trivial. No evidence of tricuspid stenosis. Aortic Valve: The aortic valve is tricuspid. Aortic valve regurgitation is not visualized. Aortic valve sclerosis is present, with no evidence of aortic valve stenosis. Aortic valve mean gradient measures 3.0 mmHg. Aortic valve peak gradient measures 5.8  mmHg. Aortic valve area, by VTI measures 2.47 cm. Pulmonic Valve: The pulmonic valve was grossly normal. Pulmonic valve  regurgitation is trivial. No evidence of pulmonic stenosis. Aorta: The aortic root and ascending aorta are structurally normal, with no evidence of dilitation. Venous: The right lower pulmonary vein is normal. The inferior vena cava is normal in size with greater than 50% respiratory variability, suggesting right atrial pressure of 3 mmHg. IAS/Shunts: The atrial septum is grossly normal.  LEFT VENTRICLE PLAX 2D LVIDd:         4.30 cm   Diastology LVIDs:         3.10 cm   LV e' medial:    5.63 cm/s LV PW:         1.40 cm   LV E/e' medial:  14.0 LV IVS:        1.60 cm   LV e' lateral:   7.43 cm/s LVOT diam:     2.30 cm   LV E/e' lateral: 10.6 LV SV:         58 LV SV Index:   26 LVOT Area:     4.15 cm  RIGHT VENTRICLE             IVC RV Basal diam:  3.20 cm     IVC diam: 2.00 cm RV Mid diam:    2.60 cm RV S prime:     11.20 cm/s TAPSE (M-mode): 2.0 cm LEFT ATRIUM             Index        RIGHT ATRIUM           Index LA diam:        4.20 cm 1.88 cm/m   RA Area:     11.90 cm LA Vol (A2C):   41.0 ml 18.36 ml/m  RA Volume:   23.70 ml  10.61 ml/m LA Vol (A4C):   37.2 ml 16.65 ml/m LA Biplane Vol: 41.5 ml 18.58 ml/m  AORTIC VALVE                    PULMONIC VALVE AV Area (Vmax):    2.85 cm     PV Vmax:       0.90 m/s AV Area (Vmean):   2.64 cm     PV Peak grad:  3.2 mmHg AV Area (VTI):     2.47 cm AV Vmax:           120.00 cm/s AV Vmean:          83.400 cm/s AV VTI:            0.234 m AV Peak Grad:      5.8 mmHg AV Mean Grad:      3.0 mmHg LVOT Vmax:         82.30 cm/s LVOT Vmean:        52.900 cm/s LVOT VTI:          0.139 m LVOT/AV VTI ratio: 0.59  AORTA Ao Root diam: 3.60 cm Ao Asc diam:  3.80 cm MITRAL VALVE MV Area (PHT): 3.87 cm    SHUNTS MV Decel Time: 196 msec    Systemic VTI:  0.14 m MV E velocity: 79.10 cm/s  Systemic Diam: 2.30 cm MV A velocity: 77.00 cm/s MV E/A ratio:  1.03 Lennie Odor MD Electronically signed by Lennie Odor MD Signature Date/Time: 06/20/2022/11:00:27 AM    Final    CARDIAC  CATHETERIZATION  Result Date: 06/20/2022 Images from the original result were not included.   Prox RCA to Mid RCA lesion is 30% stenosed.   3rd RPL lesion is 100% stenosed.   3rd Mrg lesion is 100% stenosed.   1st Mrg lesion is 100% stenosed.   Lat 1st Mrg lesion is 80% stenosed.   The left ventricular systolic function is normal.   LV end diastolic pressure is mildly elevated.   The left ventricular ejection fraction is 55-65% by visual estimate. Jorge Green is a 52 y.o. male  [id number] LOCATION:  FACILITY: [facility] PHYSICIAN: Nanetta Batty, M.D. 1970/08/26 DATE OF PROCEDURE:  06/20/2022 DATE OF DISCHARGE: CARDIAC CATHETERIZATION History obtained from chart review.Jorge Green is a 52 y.o. male with HTN, DM, Coronary calcifications, tobacco abuse, OSA, HLD on statin  who is being seen 06/19/2022 for the evaluation of chest pain.  He ended up having a non-STEMI with a peak troponin in the 3000 range.  His EKG showed no acute changes.  He has been on IV heparin and nitroglycerin in unit to which.  He presents now for diagnostic coronary angiography.   Mr. Cranmer was admitted with a non-STEMI.  He has coronary anatomy consistent with "diabetic vessels" with an occluded distal right PL 3, distal left PL branch and an occluded distal first marginal branch with highly diseased inferolateral branch of this.  His LAD and diagonal vessels had minor irregularities.  LV function was normal.  I do not think that there were any PCI targets.  Recommend aggressive medical therapy and risk factor modification.  We talked about smoking cessation and getting his diabetes under better control.  The sheath was removed and a TR band was placed on the right wrist to achieve patent hemostasis.  The patient left lab in stable condition. Nanetta Batty. MD, [facility] 06/20/2022 8:32 AM    DG Chest 2 View  Result Date: 06/19/2022 CLINICAL DATA:  Chest pressure EXAM: CHEST - 2 VIEW COMPARISON:  04/29/2017 FINDINGS: Cardiac and  mediastinal contours  are within normal limits. No focal pulmonary opacity. No pleural effusion or pneumothorax. No acute osseous abnormality. IMPRESSION: No acute cardiopulmonary process. Electronically Signed   By: Wiliam Ke M.D.   On: 06/19/2022 14:41   Disposition   Patient is being discharged home today in good condition.  Follow-up Plans & Appointments     Follow-up Information     Corrin Parker, PA-C Follow up.   Specialty: Cardiology Why: Hospital follow-up with Cardiology scheduled for 06/29/2022 at 8:50am. Please arrive 15 minutes early for check-in. If this date/time does not work for you, please call our office to reschedule. Contact information: [address] [place] [address] [phone]                Discharge Instructions     Amb Referral to Cardiac Rehabilitation   Complete by: As directed    Diagnosis: NSTEMI   After initial evaluation and assessments completed: Virtual Based Care may be provided alone or in conjunction with Phase 2 Cardiac Rehab based on patient barriers.: Yes   Intensive Cardiac Rehabilitation (ICR) [facility] location only OR Traditional Cardiac Rehabilitation (TCR) *If criteria for ICR are not met will enroll in TCR [facility] only): Yes        Discharge Medications   Allergies as of 06/21/2022   No Known Allergies      Medication List     STOP taking these medications    Excedrin Extra Strength 250-250-65 MG tablet Generic drug: aspirin-acetaminophen-caffeine   IBU-200 200 MG tablet Generic drug: ibuprofen   losartan 100 MG tablet Commonly known as: COZAAR   Potassium 99 MG Tabs       TAKE these medications    aspirin EC 81 MG tablet Take 81 mg by mouth in the morning.   atorvastatin 40 MG tablet Commonly known as: LIPITOR Take 1 tablet (40 mg total) by mouth daily. What changed:  medication strength how much to take   carvedilol 12.5 MG tablet Commonly known as: COREG Take 1 tablet (12.5  mg total) by mouth 2 (two) times daily with a meal.   clopidogrel 75 MG tablet Commonly known as: PLAVIX Take 1 tablet (75 mg total) by mouth daily.   empagliflozin 10 MG Tabs tablet Commonly known as: JARDIANCE Take 10 mg by mouth daily.   FreeStyle Libre 2 Sensor Misc Inject 1 Device into the skin every 14 (fourteen) days.   icosapent Ethyl 1 g capsule Commonly known as: VASCEPA Take 2 capsules (2 g total) by mouth 2 (two) times daily.   irbesartan 150 MG tablet Commonly known as: AVAPRO Take 1 tablet (150 mg total) by mouth daily.   isosorbide mononitrate 30 MG 24 hr tablet Commonly known as: IMDUR Take 1 tablet (30 mg total) by mouth daily.   Levemir FlexPen 100 UNIT/ML FlexPen Generic drug: insulin detemir Inject 60 Units into the skin every evening.   metFORMIN 1000 MG tablet Commonly known as: GLUCOPHAGE Take 1,000 mg by mouth 2 (two) times daily.   multivitamin with minerals Tabs tablet Take 1 tablet by mouth daily.   nicotine 14 mg/24hr patch Commonly known as: NICODERM CQ - dosed in mg/24 hours Place 1 patch (14 mg total) onto the skin daily at 6 (six) AM.   NIFEdipine 90 MG 24 hr tablet Commonly known as: ADALAT CC Take 90 mg by mouth daily.   nitroGLYCERIN 0.4 MG SL tablet Commonly known as: NITROSTAT Place 1 tablet (0.4 mg total) under the tongue every 5 (  five) minutes x 3 doses as needed for chest pain.   spironolactone 25 MG tablet Commonly known as: ALDACTONE Take 25 mg by mouth daily.            Outstanding Labs/Studies   - Repeat BMET at follow-up visit. - Repeat lipid panel and LFTs in 6-8 weeks.  Duration of Discharge Encounter   Greater than 30 minutes including physician time.  Signed, Corrin Parker, PA-C 06/21/2022, 9:08 AM

## 2022-06-21 NOTE — Progress Notes (Signed)
CARDIAC REHAB PHASE I   Pt getting ready for d/c on arrival. Discussed with pt and wife restrictions, smoking cessation, diet (DM and low sodium), exercise, NTG, and CRPII. Pt receptive. He is going to try the nicotine patch. Likes to lift weights, instructed to focus on aerobic exercise until he sees MD. Referred to [place] CRPII.  [phone]  Ethelda Chick BS, ACSM-CEP 06/21/2022 10:38 AM

## 2022-06-22 LAB — LIPOPROTEIN A (LPA): Lipoprotein (a): 189.6 nmol/L — ABNORMAL HIGH (ref <75.0)

## 2022-06-24 NOTE — Progress Notes (Unsigned)
Cardiology Office Note:    Date:  06/29/2022   ID:  Jorge Green, DOB 05-03-1970, MRN [id number]  PCP:  Carilyn Goodpasture, NP  Cardiologist:  Reatha Harps, MD  Electrophysiologist:  None   Referring MD: Carilyn Goodpasture, NP   Chief Complaint: hospital follow-up of NSTEMI  History of Present Illness:    Jorge Green is a 52 y.o. male with a history of CAD with recent NSTEMI on 06/19/2022 (medical therapy recommended given no PCI targets), hypertension, hyperlipidemia, type 2 diabetes mellitus, obstructive sleep apnea, and tobacco abuse who is followed by Dr. Flora Lipps and presents today for hospital follow-up of NSTEMI.  Patient was recently admitted from 06/19/2022 to 06/21/2022 for NSTEMI after presenting with chest pain. High-sensitivity troponin peaked at 3,154. LHC showed 30% stenosis of proximal to mid RCA, 100% stenosis of 3rd RPL, 100% stenosis of OM1 and OM3, and 80% stenosis of 1st lateral marginal vessel. Anatomy consistent with diabetic vessels. Medical thearpy was recommended given no PCI targets. Echo showed LVEF of 55-60% with normal wall motion, moderate LVH, and grade 1 diastolic dysfunction. He was started on DAPT with Aspirin and Plavix as well as Coreg and Imdur. Home Lipitor was increased and home Losartan was switched to Irbesartan for more BP control.  Patient presents today for follow-up. He has done well since recent discharge. No recurrent chest pain. No shortness of breath, orthopnea, PND, palpitations, lightheadedness, dizziness, or syncope. He is compliant with his medications and tolerating them well. His BP is much better controlled today at 120/84. He does report an occasional posterior headache which he thinks is due to when his BP is elevated. He had a headache yesterday after eating a high sodium meal (hotdog) and BP was in the 160s/120s at that time. He continues to smoke but has cut back significantly. He was previously smoking 1 pack per day but is down to  only 2 cigarettes per day and not even every day.  Past Medical History:  Diagnosis Date   CAD (coronary artery disease)    Diabetes mellitus without complication (HCC)    Hyperlipidemia    Hypertension    Obstructive sleep apnea    Tobacco abuse     Past Surgical History:  Procedure Laterality Date   ANKLE SURGERY     LEFT HEART CATH AND CORONARY ANGIOGRAPHY N/A 06/20/2022   Procedure: LEFT HEART CATH AND CORONARY ANGIOGRAPHY;  Surgeon: Runell Gess, MD;  Location: [facility];  Service: Cardiovascular;  Laterality: N/A;    Current Medications: Current Meds  Medication Sig   aspirin EC 81 MG tablet Take 81 mg by mouth in the morning.   atorvastatin (LIPITOR) 40 MG tablet Take 1 tablet (40 mg total) by mouth daily.   carvedilol (COREG) 25 MG tablet Take 1 tablet (25 mg total) by mouth 2 (two) times daily.   clopidogrel (PLAVIX) 75 MG tablet Take 1 tablet (75 mg total) by mouth daily.   Continuous Glucose Sensor (FREESTYLE LIBRE 2 SENSOR) MISC Inject 1 Device into the skin every 14 (fourteen) days.   empagliflozin (JARDIANCE) 10 MG TABS tablet Take 10 mg by mouth daily.   hydrochlorothiazide (HYDRODIURIL) 25 MG tablet Take 25 mg by mouth daily. For 90 days   icosapent Ethyl (VASCEPA) 1 g capsule Take 2 capsules (2 g total) by mouth 2 (two) times daily.   irbesartan (AVAPRO) 150 MG tablet Take 1 tablet (150 mg total) by mouth daily.   isosorbide mononitrate (IMDUR) 30  MG 24 hr tablet Take 1 tablet (30 mg total) by mouth daily.   LEVEMIR FLEXPEN 100 UNIT/ML FlexPen Inject 60 Units into the skin every evening.   metFORMIN (GLUCOPHAGE) 1000 MG tablet Take 1,000 mg by mouth 2 (two) times daily.   Multiple Vitamin (MULTI VITAMIN) TABS 1 tablet Orally Once a day   Multiple Vitamin (MULTIVITAMIN WITH MINERALS) TABS tablet Take 1 tablet by mouth daily.   nicotine (NICODERM CQ - DOSED IN MG/24 HOURS) 14 mg/24hr patch Place 1 patch (14 mg total) onto the skin daily at 6 (six) AM.    NIFEdipine (ADALAT CC) 90 MG 24 hr tablet Take 90 mg by mouth daily.   nitroGLYCERIN (NITROSTAT) 0.4 MG SL tablet Place 1 tablet (0.4 mg total) under the tongue every 5 (five) minutes x 3 doses as needed for chest pain.   spironolactone (ALDACTONE) 25 MG tablet Take 25 mg by mouth daily.   [DISCONTINUED] carvedilol (COREG) 12.5 MG tablet Take 1 tablet (12.5 mg total) by mouth 2 (two) times daily with a meal.     Allergies:   Patient has no known allergies.   Social History   Socioeconomic History   Marital status: Married    Spouse name: Not on file   Number of children: 8   Years of education: Not on file   Highest education level: Not on file  Occupational History   Occupation: Floor Tech  Tobacco Use   Smoking status: Some Days    Packs/day: 1.00    Years: 34.00    Additional pack years: 0.00    Total pack years: 34.00    Types: Cigarettes   Smokeless tobacco: Never  Substance and Sexual Activity   Alcohol use: Never   Drug use: Yes    Types: Marijuana   Sexual activity: Yes    Partners: Female  Other Topics Concern   Not on file  Social History Narrative   Not on file   Social Determinants of Health   Financial Resource Strain: Not on file  Food Insecurity: Not on file  Transportation Needs: Not on file  Physical Activity: Not on file  Stress: Not on file  Social Connections: Not on file     Family History: The patient's family history includes Diabetes in his mother; Hypertension in his mother; Stroke in his mother.  ROS:   Please see the history of present illness.     EKGs/Labs/Other Studies Reviewed:    The following studies were reviewed:  Left Cardiac Catheterization 06/20/2022:   Prox RCA to Mid RCA lesion is 30% stenosed.   3rd RPL lesion is 100% stenosed.   3rd Mrg lesion is 100% stenosed.   1st Mrg lesion is 100% stenosed.   Lat 1st Mrg lesion is 80% stenosed.   The left ventricular systolic function is normal.   LV end diastolic  pressure is mildly elevated.   The left ventricular ejection fraction is 55-65% by visual estimate.   Impressions: Mr. Kirchhoff was admitted with a non-STEMI. He has coronary anatomy consistent with "diabetic vessels" with an occluded distal right PL 3, distal left PL branch and an occluded distal first marginal branch with highly diseased inferolateral branch of this. His LAD and diagonal vessels had minor irregularities. LV function was normal. I do not think that there were any PCI targets. Recommend aggressive medical therapy and risk factor modification. We talked about smoking cessation and getting his diabetes under better control. The sheath was removed and a TR band  was placed on the right wrist to achieve patent hemostasis. The patient left lab in stable condition.    Diagnostic Dominance: Right      _____________   Echocardiogram 06/20/2022: Impressions: 1. Left ventricular ejection fraction, by estimation, is 55 to 60%. The  left ventricle has normal function. The left ventricle has no regional  wall motion abnormalities. There is moderate concentric left ventricular  hypertrophy. Left ventricular  diastolic parameters are consistent with Grade I diastolic dysfunction  (impaired relaxation).   2. Right ventricular systolic function is normal. The right ventricular  size is normal. Tricuspid regurgitation signal is inadequate for assessing  PA pressure.   3. The mitral valve is grossly normal. Trivial mitral valve  regurgitation. No evidence of mitral stenosis.   4. The aortic valve is tricuspid. Aortic valve regurgitation is not  visualized. Aortic valve sclerosis is present, with no evidence of aortic  valve stenosis.   5. The inferior vena cava is normal in size with greater than 50%  respiratory variability, suggesting right atrial pressure of 3 mmHg.   EKG:  EKG not ordered today.   Recent Labs: 06/19/2022: ALT 21; Magnesium 2.0; TSH 2.592 06/21/2022: BUN 9;  Creatinine, Ser 0.84; Hemoglobin 15.2; Platelets 288; Potassium 3.8; Sodium 134  Recent Lipid Panel    Component Value Date/Time   CHOL 116 06/19/2022 2353   TRIG 240 (H) 06/19/2022 2353   HDL 24 (L) 06/19/2022 2353   CHOLHDL 4.8 06/19/2022 2353   VLDL 48 (H) 06/19/2022 2353   LDLCALC 44 06/19/2022 2353    Physical Exam:    Vital Signs: BP 120/84   Pulse 84   Ht 5\' 11"  (1.803 m)   Wt 230 lb 9.6 oz (104.6 kg)   SpO2 99%   BMI 32.16 kg/m     Wt Readings from Last 3 Encounters:  06/29/22 230 lb 9.6 oz (104.6 kg)  06/21/22 225 lb (102.1 kg)  11/25/21 231 lb (104.8 kg)     General: 52 y.o. African-American male in no acute distress. HEENT: Normocephalic and atraumatic. Sclera clear.  Neck: Supple. No carotid bruits. No JVD. Heart: RRR. Distinct S1 and S2. No murmurs, gallops, or rubs. Radial pulses 2+ and equal bilaterally.  Lungs: No increased work of breathing. Clear to ausculation bilaterally. No wheezes, rhonchi, or rales.  Abdomen: Soft, non-distended, and non-tender to palpation.  Extremities: No lower extremity edema. Right radial cath site soft with no signs of hematoma. Skin: Warm and dry. Neuro: Alert and oriented x3. No focal deficits. Psych: Normal affect. Responds appropriately.   Assessment:    1. Coronary artery disease involving native coronary artery of native heart without angina pectoris   2. History of non-ST elevation myocardial infarction (NSTEMI)   3. Primary hypertension   4. Hyperlipidemia, unspecified hyperlipidemia type   5. Type 2 diabetes mellitus with complication, with long-term current use of insulin (HCC)   6. OSA (obstructive sleep apnea)   7. Tobacco abuse     Plan:    CAD with Recent NSTEMI Patient was recently admitted earlier this month for NSTEMI. LHC showed 30% stenosis of proximal to mid RCA, 100% stenosis of 3rd RPL, 100% stenosis of OM1 and OM3, and 80% stenosis of 1st lateral marginal vessel. Anatomy consistent with diabetic  vessels. Medical thearpy was recommended given no PCI targets.  - No recurrent chest pain.  - Continue Coreg 12.5mg  twice daily and Imdur 30mg  daily. - Continue DAPT with Aspirin and Plavix for 12 months.  -  Continue high-intensity statin.  - Recommend Cardiac Rehab.  Hypertension Patient has a history of hypertension that has been difficult to control. Medications were adjusted during recent admission. - BP much better controlled today at 120/84. However, he still notes spikes in his BP at home occasionally. - Current medications: Nifedipine 90mg  daily, Irbesartan 150mg  daily, Coreg 12.5mg  twice daily, Spironolactone 25mg  daily, and Imdur 30mg  daily.  - Will increase Coreg to 25mg  twice daily.  - Will repeat BMET today after recent switch to Irbesartan.  Hyperlipidemia Lipid panel on 06/19/2022 during recent admission: Total Cholesterol 116, Triglycerides 240, HDL 24, LDL 44. LDL goal <70 given CAD. Lipoprotein A was 189.6 on 06/19/2022 and 55.5 on 06/21/2022.  - Lipitor was increased to 40mg  daily during recent admission. Continue. - Will repeat lipid panel and LFTs in 6-8 weeks.   Type 2 Diabetes Mellitus Hemoglobin A1c 7.6% on 06/19/2022  during recent admission. - On Metformin, Jardiance, and Insulin (Levemir). - Management per PCP.  Obstructive Sleep Apnea Patient states he was previously on CPAP but his insurance took this away over 1 year ago because they said he was not using it enough. - Patient states he would like to use CPAP again.  - Will order split night sleep study (he has not had success with the home studies before). STOP BANG score = 4-5.  Tobacco Abuse Patient is still smoking but has significantly cut back. He was previously smoking 1 pack per day but is down to only 2 cigarettes per day and not even every day. - Congratulated patient on the progress made so far and encouraged him to continue to work towards complete cessation.   Disposition: Follow up in 3  months.   Medication Adjustments/Labs and Tests Ordered: Current medicines are reviewed at length with the patient today.  Concerns regarding medicines are outlined above.  Orders Placed This Encounter  Procedures   Basic metabolic panel   Hepatic function panel   Lipid panel   Meds ordered this encounter  Medications   carvedilol (COREG) 25 MG tablet    Sig: Take 1 tablet (25 mg total) by mouth 2 (two) times daily.    Dispense:  180 tablet    Refill:  3    Discontinue  12.5 mg dose    Patient Instructions  Medication Instructions:    Stop taking Carvedilol 12.5 mg   Start taking Carvedilol 25 mg twice a day   *If you need a refill on your cardiac medications before your next appointment, please call your pharmacy*   Lab Work: BMP -- today    LIPID And Liver profile - 2 months  - fasting  ( week of 08/29/22)   If you have labs (blood work) drawn today and your tests are completely normal, you will receive your results only by: MyChart Message (if you have MyChart) OR A paper copy in the mail If you have any lab test that is abnormal or we need to change your treatment, we will call you to review the results.   Testing/Procedures:  Will contact you in regards to sleep study  and C-PAP  Follow-Up: At [facility], you and your health needs are our priority.  As part of our continuing mission to provide you with exceptional heart care, we have created designated Provider Care Teams.  These Care Teams include your primary Cardiologist (physician) and Advanced Practice Providers (APPs -  Physician Assistants and Nurse Practitioners) who all work together to provide you with the care  you need, when you need it.     Your next appointment:   3 month(s)  The format for your next appointment:   In Person  Provider:  Marjie Skiff PA or Dr Val Eagle' [address]      Signed, Corrin Parker, PA-C  06/29/2022 1:44 PM    [facility]

## 2022-06-29 ENCOUNTER — Ambulatory Visit: Payer: BC Managed Care – PPO | Attending: Student | Admitting: Student

## 2022-06-29 ENCOUNTER — Encounter: Payer: Self-pay | Admitting: Student

## 2022-06-29 ENCOUNTER — Telehealth (HOSPITAL_COMMUNITY): Payer: Self-pay

## 2022-06-29 VITALS — BP 120/84 | HR 84 | Ht 71.0 in | Wt 230.6 lb

## 2022-06-29 DIAGNOSIS — G4733 Obstructive sleep apnea (adult) (pediatric): Secondary | ICD-10-CM

## 2022-06-29 DIAGNOSIS — I2510 Atherosclerotic heart disease of native coronary artery without angina pectoris: Secondary | ICD-10-CM | POA: Diagnosis not present

## 2022-06-29 DIAGNOSIS — I10 Essential (primary) hypertension: Secondary | ICD-10-CM

## 2022-06-29 DIAGNOSIS — I252 Old myocardial infarction: Secondary | ICD-10-CM

## 2022-06-29 DIAGNOSIS — Z720 Tobacco use: Secondary | ICD-10-CM

## 2022-06-29 DIAGNOSIS — E118 Type 2 diabetes mellitus with unspecified complications: Secondary | ICD-10-CM

## 2022-06-29 DIAGNOSIS — E785 Hyperlipidemia, unspecified: Secondary | ICD-10-CM

## 2022-06-29 DIAGNOSIS — Z794 Long term (current) use of insulin: Secondary | ICD-10-CM

## 2022-06-29 LAB — BASIC METABOLIC PANEL
BUN/Creatinine Ratio: 13 (ref 9–20)
BUN: 14 mg/dL (ref 6–24)
CO2: 24 mmol/L (ref 20–29)
Calcium: 9.9 mg/dL (ref 8.7–10.2)
Chloride: 100 mmol/L (ref 96–106)
Creatinine, Ser: 1.06 mg/dL (ref 0.76–1.27)
Glucose: 110 mg/dL — ABNORMAL HIGH (ref 70–99)
Potassium: 5.2 mmol/L (ref 3.5–5.2)
Sodium: 138 mmol/L (ref 134–144)
eGFR: 84 mL/min/{1.73_m2} (ref >59)

## 2022-06-29 MED ORDER — CARVEDILOL 25 MG PO TABS: 25.0000 mg | ORAL_TABLET | Freq: Two times a day (BID) | ORAL | 3 refills | Status: DC

## 2022-06-29 NOTE — Telephone Encounter (Signed)
Pt insurance is active and benefits verified through [employer]. Co-pay $0.00, DED $1,250.00/$1,250.00 met, out of pocket $4,890.00/$1,672.37 met, co-insurance 20%. No pre-authorization required. Passport, 06/29/22 @ 12:09PM, REF#[id number]   How many CR sessions are covered? (36 visits for TCR, 72 visits for ICR)72 Is this a lifetime maximum or an annual maximum? Annual Has the member used any of these services to date? No Is there a time limit (weeks/months) on start of program and/or program completion? No     Will contact patient to see if he is interested in the Cardiac Rehab Program. If interested, patient will need to complete follow up appt. Once completed, patient will be contacted for scheduling upon review by the RN Navigator.

## 2022-06-29 NOTE — Patient Instructions (Addendum)
Medication Instructions:    Stop taking Carvedilol 12.5 mg   Start taking Carvedilol 25 mg twice a day   *If you need a refill on your cardiac medications before your next appointment, please call your pharmacy*   Lab Work: BMP -- today    LIPID And Liver profile - 2 months  - fasting  ( week of 08/29/22)   If you have labs (blood work) drawn today and your tests are completely normal, you will receive your results only by: MyChart Message (if you have MyChart) OR A paper copy in the mail If you have any lab test that is abnormal or we need to change your treatment, we will call you to review the results.   Testing/Procedures:  Will contact you in regards to sleep study  and C-PAP  Follow-Up: At [facility], you and your health needs are our priority.  As part of our continuing mission to provide you with exceptional heart care, we have created designated Provider Care Teams.  These Care Teams include your primary Cardiologist (physician) and Advanced Practice Providers (APPs -  Physician Assistants and Nurse Practitioners) who all work together to provide you with the care you need, when you need it.     Your next appointment:   3 month(s)  The format for your next appointment:   In Person  Provider:  Marjie Skiff PA or Dr Val EagleJennette Kettle

## 2022-06-29 NOTE — Telephone Encounter (Signed)
Called and spoke with pt in regards to CR, pt stated he is not interested at this time due to his schedule.   Closed referral

## 2022-07-05 ENCOUNTER — Telehealth: Payer: Self-pay

## 2022-07-05 ENCOUNTER — Telehealth: Payer: Self-pay | Admitting: *Deleted

## 2022-07-05 DIAGNOSIS — I10 Essential (primary) hypertension: Secondary | ICD-10-CM

## 2022-07-05 DIAGNOSIS — G4733 Obstructive sleep apnea (adult) (pediatric): Secondary | ICD-10-CM

## 2022-07-05 DIAGNOSIS — I2510 Atherosclerotic heart disease of native coronary artery without angina pectoris: Secondary | ICD-10-CM

## 2022-07-05 NOTE — Telephone Encounter (Signed)
Called left message for patient on secure voicemail. Left message  patient doesn't need to repeat Sleep study from 11/2021, per sleep coordinator.    Orders have been sent DME company - [employer]  For new machine - C-PAP with  settings.   Message  - if patient has not heard from DME company in 2 to 3 weeks please contact office. Phone number left [phone].

## 2022-07-05 NOTE — Telephone Encounter (Signed)
Ordered new CPAP machine and supplies through [facility] today 07/05/22.

## 2022-08-10 ENCOUNTER — Telehealth: Payer: Self-pay | Admitting: Student

## 2022-08-10 MED ORDER — ISOSORBIDE MONONITRATE ER 30 MG PO TB24: 30.0000 mg | ORAL_TABLET | Freq: Every day | ORAL | 3 refills | Status: DC

## 2022-08-10 MED ORDER — CARVEDILOL 25 MG PO TABS: 25.0000 mg | ORAL_TABLET | Freq: Two times a day (BID) | ORAL | 3 refills | Status: DC

## 2022-08-10 MED ORDER — IRBESARTAN 150 MG PO TABS: 150.0000 mg | ORAL_TABLET | Freq: Every day | ORAL | 3 refills | Status: DC

## 2022-08-10 MED ORDER — CLOPIDOGREL BISULFATE 75 MG PO TABS: 75.0000 mg | ORAL_TABLET | Freq: Every day | ORAL | 3 refills | Status: DC

## 2022-08-10 NOTE — Telephone Encounter (Signed)
Pt's medications were sent to pt's pharmacy as requested. Confirmation received.  

## 2022-08-10 NOTE — Telephone Encounter (Signed)
*  STAT* If patient is at the pharmacy, call can be transferred to refill team.   1. Which medications need to be refilled? (please list name of each medication and dose if known) new prescriptions for Carvedilol, Irbesartan, Isosorbide, Nicotine Patch, and Clopidogrel   2. Would you like to learn more about the convenience, safety, & potential cost savings by using the [facility]?      3. Are you open to using the [facility] (Type [facility].   4. Which pharmacy/location (including street and city if local pharmacy) is medication to be sent to?[facility] [address], [place]   5. Do they need a 30 day or 90 day supply? 90 days and refills

## 2022-08-15 NOTE — Addendum Note (Signed)
Addended by: Reesa Chew on: 08/15/2022 03:50 PM   Modules accepted: Orders

## 2022-08-15 NOTE — Telephone Encounter (Signed)
Order placed in epic

## 2022-09-04 NOTE — Progress Notes (Deleted)
Cardiology Office Note:    Date:  09/04/2022   ID:  Jorge Green, DOB August 05, 1970, MRN [id number]  PCP:  Carilyn Goodpasture, NP  Cardiologist:  Reatha Harps, MD  Electrophysiologist:  None   Referring MD: Carilyn Goodpasture, NP   Chief Complaint: follow-up of CAD  History of Present Illness:    Jorge Green is a 52 y.o. male with a history of CAD with recent NSTEMI in 06/2022 (medical therapy recommended given no PCI targets), hypertension, hyperlipidemia, type 2 diabetes mellitus, obstructive sleep apnea, and tobacco abuse who is followed by Dr. Flora Lipps and presents today for follow-up of CAD.   Patient was recently admitted from 06/19/2022 to 06/21/2022 for NSTEMI after presenting with chest pain. High-sensitivity troponin peaked at 3,154. LHC showed 30% stenosis of proximal to mid RCA, 100% stenosis of 3rd RPL, 100% stenosis of OM1 and OM3, and 80% stenosis of 1st lateral marginal vessel. Anatomy consistent with diabetic vessels. Medical thearpy was recommended given no PCI targets. Echo showed LVEF of 55-60% with normal wall motion, moderate LVH, and grade 1 diastolic dysfunction. He was started on DAPT with Aspirin and Plavix as well as Coreg and Imdur. Home Lipitor was increased and home Losartan was switched to Irbesartan for more BP control.  He was last seen by me later that month for hospital follow-up at which time he was doing well with no recurrent chest pain. Coreg was increased to 25mg  twice daily for additional BP control. He reported his insurance had taken back his CPAP machine about 1 year ago because he was not using it enough. He stated he wanted to use CPAP again; therefore, repeat split night sleep study was ordered.  Patient presents today for follow-up. ***   CAD with Recent NSTEMI Patient was recently admitted in 06/2022 with a NSTEMI. LHC showed 30% stenosis of proximal to mid RCA, 100% stenosis of 3rd RPL, 100% stenosis of OM1 and OM3, and 80% stenosis of 1st  lateral marginal vessel. Anatomy consistent with diabetic vessels. Medical thearpy was recommended given no PCI targets.  - No recurrent chest pain.  - Continue Coreg 12.5mg  twice daily and Imdur 30mg  daily. - Continue DAPT with Aspirin and Plavix for 12 months.  - Continue high-intensity statin.    Hypertension Patient has a history of hypertension that has been difficult to control.  BP *** - Continue current medications: Nifedipine 90mg  daily, Irbesartan 150mg  daily, Coreg 25mg  twice daily, Spironolactone 25mg  daily, and Imdur 30mg  daily.    Hyperlipidemia Lipid panel in 06/2022 during an admission for MI Total Cholesterol 116, Triglycerides 240, HDL 24, LDL 44. LDL goal <70 given CAD. Lipoprotein A was 189.6 on 06/19/2022 and 55.5 on 06/21/2022.  - Lipitor was increased to 40mg  daily during recent admission. Continue. - Will repeat lipid panel and LFTs. ***   Type 2 Diabetes Mellitus Hemoglobin A1c 7.6% in 06/2022.  - On Metformin, Jardiance, and Insulin (Levemir). - Management per PCP.   Obstructive Sleep Apnea Patient states he was previously on CPAP but his insurance took this away over 1 year ago because they said he was not using it enough. - Split night sleep study was ordered at last visit. ***   Tobacco Abuse Patient is still smoking but has significantly cut back. He was previously smoking 1 pack per day but is down to only 2 cigarettes per day and not even every day. *** - Congratulated patient on the progress made so far and encouraged him to continue  to work towards complete cessation.  EKGs/Labs/Other Studies Reviewed:    The following studies were reviewed:  Left Cardiac Catheterization 06/20/2022:   Prox RCA to Mid RCA lesion is 30% stenosed.   3rd RPL lesion is 100% stenosed.   3rd Mrg lesion is 100% stenosed.   1st Mrg lesion is 100% stenosed.   Lat 1st Mrg lesion is 80% stenosed.   The left ventricular systolic function is normal.   LV end diastolic pressure is  mildly elevated.   The left ventricular ejection fraction is 55-65% by visual estimate.   Impressions: Jorge Green was admitted with a non-STEMI. He has coronary anatomy consistent with "diabetic vessels" with an occluded distal right PL 3, distal left PL branch and an occluded distal first marginal branch with highly diseased inferolateral branch of this. His LAD and diagonal vessels had minor irregularities. LV function was normal. I do not think that there were any PCI targets. Recommend aggressive medical therapy and risk factor modification. We talked about smoking cessation and getting his diabetes under better control. The sheath was removed and a TR band was placed on the right wrist to achieve patent hemostasis. The patient left lab in stable condition.    Diagnostic Dominance: Right      _____________   Echocardiogram 06/20/2022: Impressions: 1. Left ventricular ejection fraction, by estimation, is 55 to 60%. The  left ventricle has normal function. The left ventricle has no regional  wall motion abnormalities. There is moderate concentric left ventricular  hypertrophy. Left ventricular  diastolic parameters are consistent with Grade I diastolic dysfunction  (impaired relaxation).   2. Right ventricular systolic function is normal. The right ventricular  size is normal. Tricuspid regurgitation signal is inadequate for assessing  PA pressure.   3. The mitral valve is grossly normal. Trivial mitral valve  regurgitation. No evidence of mitral stenosis.   4. The aortic valve is tricuspid. Aortic valve regurgitation is not  visualized. Aortic valve sclerosis is present, with no evidence of aortic  valve stenosis.   5. The inferior vena cava is normal in size with greater than 50%  respiratory variability, suggesting right atrial pressure of 3 mmHg.  EKG:  EKG not ordered today.   Recent Labs: 06/19/2022: ALT 21; Magnesium 2.0; TSH 2.592 06/21/2022: Hemoglobin 15.2; Platelets  288 06/29/2022: BUN 14; Creatinine, Ser 1.06; Potassium 5.2; Sodium 138  Recent Lipid Panel    Component Value Date/Time   CHOL 116 06/19/2022 2353   TRIG 240 (H) 06/19/2022 2353   HDL 24 (L) 06/19/2022 2353   CHOLHDL 4.8 06/19/2022 2353   VLDL 48 (H) 06/19/2022 2353   LDLCALC 44 06/19/2022 2353    Physical Exam:    Vital Signs: There were no vitals taken for this visit.    Wt Readings from Last 3 Encounters:  06/29/22 230 lb 9.6 oz (104.6 kg)  06/21/22 225 lb (102.1 kg)  11/25/21 231 lb (104.8 kg)     General: 52 y.o. male in no acute distress. HEENT: Normocephalic and atraumatic. Sclera clear. EOMs intact. Neck: Supple. No carotid bruits. No JVD. Heart: *** RRR. Distinct S1 and S2. No murmurs, gallops, or rubs. Radial and distal pedal pulses 2+ and equal bilaterally. Lungs: No increased work of breathing. Clear to ausculation bilaterally. No wheezes, rhonchi, or rales.  Abdomen: Soft, non-distended, and non-tender to palpation. Bowel sounds present in all 4 quadrants.  MSK: Normal strength and tone for age. *** Extremities: No lower extremity edema.    Skin: Warm  and dry. Neuro: Alert and oriented x3. No focal deficits. Psych: Normal affect. Responds appropriately.   Assessment:    No diagnosis found.  Plan:     Disposition: Follow up in ***   Medication Adjustments/Labs and Tests Ordered: Current medicines are reviewed at length with the patient today.  Concerns regarding medicines are outlined above.  No orders of the defined types were placed in this encounter.  No orders of the defined types were placed in this encounter.   There are no Patient Instructions on file for this visit.   Signed, Corrin Parker, PA-C  09/04/2022 10:26 AM    [facility]

## 2022-09-18 ENCOUNTER — Ambulatory Visit: Payer: BC Managed Care – PPO | Attending: Student | Admitting: Student

## 2022-09-19 ENCOUNTER — Encounter: Payer: Self-pay | Admitting: Student

## 2022-10-15 NOTE — Progress Notes (Signed)
Cardiology Office Note:    Date:  10/20/2022   ID:  Jorge Green, DOB [birth year], MRN [id number]  PCP:  Carilyn Goodpasture, NP  Cardiologist:  Reatha Harps, MD  Electrophysiologist:  None   Referring MD: Carilyn Goodpasture, NP   Chief Complaint: follow-up of CAD  History of Present Illness:    Jorge Green is a 52 y.o. male with a history of CAD with recent NSTEMI in 06/2022 (medical therapy recommended given no PCI targets), hypertension, hyperlipidemia, type 2 diabetes mellitus, obstructive sleep apnea, and tobacco abuse who is followed by Dr. Flora Lipps and presents today for follow-up of CAD.   Patient was recently admitted from 06/19/2022 to 06/21/2022 for NSTEMI after presenting with chest pain. High-sensitivity troponin peaked at 3,154. LHC showed 30% stenosis of proximal to mid RCA, 100% stenosis of 3rd RPL, 100% stenosis of OM1 and OM3, and 80% stenosis of 1st lateral marginal vessel. Anatomy consistent with diabetic vessels. Medical thearpy was recommended given no PCI targets. Echo showed LVEF of 55-60% with normal wall motion, moderate LVH, and grade 1 diastolic dysfunction. He was started on DAPT with Aspirin and Plavix as well as Coreg and Imdur. Home Lipitor was increased and home Losartan was switched to Irbesartan for more BP control.  He was last seen by me later that month for hospital follow-up at which time he was doing well with no recurrent chest pain. Coreg was increased to 25mg  twice daily for additional BP control. He reported his insurance had taken back his CPAP machine about 1 year ago because he was not using it enough. He stated he wanted to use CPAP again; therefore, repeat split night sleep study was ordered.  Patient presents today for follow-up. Here alone. He is doing well overall from a cardiac standpoint. He denies any chest pain. He reports some dyspnea with more vigorous activity. He is a Research scientist (physical sciences) at [employer] and will have some mild shortness of  breath if he is doing strenuous activity with this. However, no shortness of breath at rest or with routine activity around the home. No orthopnea, PND, edema. No palpitations, lightheadedness, dizziness, or syncope. His only complaint this morning is a headache after taking his medications. Suspect this is from the Imdur.     EKGs/Labs/Other Studies Reviewed:    The following studies were reviewed:  Left Cardiac Catheterization 06/20/2022:   Prox RCA to Mid RCA lesion is 30% stenosed.   3rd RPL lesion is 100% stenosed.   3rd Mrg lesion is 100% stenosed.   1st Mrg lesion is 100% stenosed.   Lat 1st Mrg lesion is 80% stenosed.   The left ventricular systolic function is normal.   LV end diastolic pressure is mildly elevated.   The left ventricular ejection fraction is 55-65% by visual estimate.   Impressions: Jorge Green was admitted with a non-STEMI. He has coronary anatomy consistent with "diabetic vessels" with an occluded distal right PL 3, distal left PL branch and an occluded distal first marginal branch with highly diseased inferolateral branch of this. His LAD and diagonal vessels had minor irregularities. LV function was normal. I do not think that there were any PCI targets. Recommend aggressive medical therapy and risk factor modification. We talked about smoking cessation and getting his diabetes under better control. The sheath was removed and a TR band was placed on the right wrist to achieve patent hemostasis. The patient left lab in stable condition.    Diagnostic Dominance: Right  _____________   Echocardiogram 06/20/2022: Impressions: 1. Left ventricular ejection fraction, by estimation, is 55 to 60%. The  left ventricle has normal function. The left ventricle has no regional  wall motion abnormalities. There is moderate concentric left ventricular  hypertrophy. Left ventricular  diastolic parameters are consistent with Grade I diastolic dysfunction  (impaired  relaxation).   2. Right ventricular systolic function is normal. The right ventricular  size is normal. Tricuspid regurgitation signal is inadequate for assessing  PA pressure.   3. The mitral valve is grossly normal. Trivial mitral valve  regurgitation. No evidence of mitral stenosis.   4. The aortic valve is tricuspid. Aortic valve regurgitation is not  visualized. Aortic valve sclerosis is present, with no evidence of aortic  valve stenosis.   5. The inferior vena cava is normal in size with greater than 50%  respiratory variability, suggesting right atrial pressure of 3 mmHg.  EKG:  EKG not ordered today.  Recent Labs: 06/19/2022: ALT 21; Magnesium 2.0; TSH 2.592 06/21/2022: Hemoglobin 15.2; Platelets 288 06/29/2022: BUN 14; Creatinine, Ser 1.06; Potassium 5.2; Sodium 138  Recent Lipid Panel    Component Value Date/Time   CHOL 116 06/19/2022 2353   TRIG 240 (H) 06/19/2022 2353   HDL 24 (L) 06/19/2022 2353   CHOLHDL 4.8 06/19/2022 2353   VLDL 48 (H) 06/19/2022 2353   LDLCALC 44 06/19/2022 2353    Physical Exam:    Vital Signs: BP 130/88 (BP Location: Left Arm, Patient Position: Sitting, Cuff Size: Normal)   Pulse 86   Ht 5\' 11"  (1.803 m)   Wt 238 lb 12.8 oz (108.3 kg)   SpO2 97%   BMI 33.31 kg/m     Wt Readings from Last 3 Encounters:  10/20/22 238 lb 12.8 oz (108.3 kg)  06/29/22 230 lb 9.6 oz (104.6 kg)  06/21/22 225 lb (102.1 kg)     General: 52 y.o. African-American male in no acute distress. Neck: Supple. No carotid bruits. No JVD. Heart: RRR. No murmurs, gallops, or rubs. Radial pulses 2+ and equal bilaterally. Lungs: No increased work of breathing. Clear to ausculation bilaterally. No wheezes, rhonchi, or rales.  Extremities: No lower extremity edema.  Radial pulses 2+ and equal bilaterally. Skin: Warm and dry. Neuro: No focal deficits. Psych: Normal affect. Responds appropriately.  Assessment:    1. Coronary artery disease involving native coronary  artery of native heart without angina pectoris   2. History of non-ST elevation myocardial infarction (NSTEMI)   3. Primary hypertension   4. Hyperlipidemia, unspecified hyperlipidemia type   5. Type 2 diabetes mellitus with complication, with long-term current use of insulin (HCC)   6. Obstructive sleep apnea   7. Tobacco abuse     Plan:    CAD with Recent NSTEMI Patient was recently admitted in 06/2022 with a NSTEMI. LHC showed 30% stenosis of proximal to mid RCA, 100% stenosis of 3rd RPL, 100% stenosis of OM1 and OM3, and 80% stenosis of 1st lateral marginal vessel. Anatomy consistent with diabetic vessels. Medical thearpy was recommended given no PCI targets.  - No recurrent chest pain.  - Will stop Imdur given headache.  - Continue Coreg 12.5mg  twice daily. - Continue DAPT with Aspirin and Plavix for 12 months.  - Continue high-intensity statin.    Hypertension Patient has a history of hypertension that has been difficult to control.   - BP borderline elevated at 130/88.  - Current medications: Nifedipine 90mg  daily, Irbesartan 150mg  daily, Coreg 25mg  twice daily, Spironolactone 25mg   daily, HCTZ 25mg  daily, and Imdur 30mg  daily.  - Will stop Imdur given headache.  - Asked patient to keep BP/HR log for 2 weeks and then send this back to me. If average BP >130/80, will like increase Irbesartan to 300mg  daily assuming renal function and potassium are stable. Will check BMET today.     Hyperlipidemia Lipid panel in 06/2022 during an admission for MI Total Cholesterol 116, Triglycerides 240, HDL 24, LDL 44. LDL goal <70 given CAD. Lipoprotein A was 189.6 on 06/19/2022 and 55.5 on 06/21/2022.  - Lipitor was increased to 40mg  daily during recent admission. Continue. - Continue Vascepa 2g twice daily.  - He is due for repeat labs. It goes to work at [employer] so it is difficult for him to come by for fasting labs in the morning. However, he has an routine physical with his PCP scheduled for next  month and should have a lipid panel rechecked at that time. Asked patient to have these labs faxed to [place].    Type 2 Diabetes Mellitus Hemoglobin A1c 7.6% in 06/2022.  - On Metformin, Jardiance, and Insulin (Levemir). - Management per PCP.   Obstructive Sleep Apnea Patient states he was previously on CPAP but his insurance took this away over 1 year ago because they said he was not using it enough. - Split night sleep study was recommended at last visit but looks like this was not ordered. Will order today. Ordered split night study because he has previously not had success with home studies.   Tobacco Abuse Patient is still smoking but has significantly cut back. He was previously smoking 1 pack per day but is down to only 3-4 cigarettes per day and not even every day.  - Congratulated patient on the progress made so far and encouraged him to continue to work towards complete cessation.  Disposition: Follow up in 6 months.    Medication Adjustments/Labs and Tests Ordered: Current medicines are reviewed at length with the patient today.  Concerns regarding medicines are outlined above.  Orders Placed This Encounter  Procedures   Basic Metabolic Panel (BMET)   Split night study   Meds ordered this encounter  Medications   icosapent Ethyl (VASCEPA) 1 g capsule    Sig: Take 2 capsules (2 g total) by mouth 2 (two) times daily.    Dispense:  120 capsule    Refill:  2   atorvastatin (LIPITOR) 40 MG tablet    Sig: Take 1 tablet (40 mg total) by mouth daily.    Dispense:  30 tablet    Refill:  11    Patient Instructions  Medication Instructions:  Stop Imdur *If you need a refill on your cardiac medications before your next appointment, please call your pharmacy*   Lab Work: Today BMP will be drawn If you have labs (blood work) drawn today and your tests are completely normal, you will receive your results only by: MyChart Message (if you have MyChart) OR A paper copy in the  mail If you have any lab test that is abnormal or we need to change your treatment, we will call you to review the results.   Testing/Procedures:  Your physician has recommended that you have a sleep study. This test records several body functions during sleep, including: brain activity, eye movement, oxygen and carbon dioxide blood levels, heart rate and rhythm, breathing rate and rhythm, the flow of air through your mouth and nose, snoring, body muscle movements, and chest and belly movement.  Follow-Up: At [facility], you and your health needs are our priority.  As part of our continuing mission to provide you with exceptional heart care, we have created designated Provider Care Teams.  These Care Teams include your primary Cardiologist (physician) and Advanced Practice Providers (APPs -  Physician Assistants and Nurse Practitioners) who all work together to provide you with the care you need, when you need it.  We recommend signing up for the patient portal called "MyChart".  Sign up information is provided on this After Visit Summary.  MyChart is used to connect with patients for Virtual Visits (Telemedicine).  Patients are able to view lab/test results, encounter notes, upcoming appointments, etc.  Non-urgent messages can be sent to your provider as well.   To learn more about what you can do with MyChart, go to [email or web address].    Your next appointment:   6 month(s)  Provider:   Reatha Harps, MD  or Marjie Skiff, Jorge Green  Other Instructions Please keep blood pressure log at home    Signed, Jorge Parker, Jorge Green  10/20/2022 2:26 PM    [facility]

## 2022-10-20 ENCOUNTER — Ambulatory Visit: Payer: BC Managed Care – PPO | Attending: Student | Admitting: Student

## 2022-10-20 ENCOUNTER — Encounter: Payer: Self-pay | Admitting: Student

## 2022-10-20 VITALS — BP 130/88 | HR 86 | Ht 71.0 in | Wt 238.8 lb

## 2022-10-20 DIAGNOSIS — I10 Essential (primary) hypertension: Secondary | ICD-10-CM | POA: Diagnosis not present

## 2022-10-20 DIAGNOSIS — E118 Type 2 diabetes mellitus with unspecified complications: Secondary | ICD-10-CM

## 2022-10-20 DIAGNOSIS — I252 Old myocardial infarction: Secondary | ICD-10-CM | POA: Diagnosis not present

## 2022-10-20 DIAGNOSIS — I2510 Atherosclerotic heart disease of native coronary artery without angina pectoris: Secondary | ICD-10-CM | POA: Diagnosis not present

## 2022-10-20 DIAGNOSIS — Z794 Long term (current) use of insulin: Secondary | ICD-10-CM

## 2022-10-20 DIAGNOSIS — E785 Hyperlipidemia, unspecified: Secondary | ICD-10-CM

## 2022-10-20 DIAGNOSIS — Z720 Tobacco use: Secondary | ICD-10-CM

## 2022-10-20 DIAGNOSIS — G4733 Obstructive sleep apnea (adult) (pediatric): Secondary | ICD-10-CM

## 2022-10-20 MED ORDER — ATORVASTATIN CALCIUM 40 MG PO TABS: 40.0000 mg | ORAL_TABLET | Freq: Every day | ORAL | 11 refills | Status: AC

## 2022-10-20 MED ORDER — ICOSAPENT ETHYL 1 G PO CAPS: 2.0000 g | ORAL_CAPSULE | Freq: Two times a day (BID) | ORAL | 2 refills | Status: AC

## 2022-10-20 NOTE — Patient Instructions (Signed)
Medication Instructions:  Stop Imdur *If you need a refill on your cardiac medications before your next appointment, please call your pharmacy*   Lab Work: Today BMP will be drawn If you have labs (blood work) drawn today and your tests are completely normal, you will receive your results only by: MyChart Message (if you have MyChart) OR A paper copy in the mail If you have any lab test that is abnormal or we need to change your treatment, we will call you to review the results.   Testing/Procedures:  Your physician has recommended that you have a sleep study. This test records several body functions during sleep, including: brain activity, eye movement, oxygen and carbon dioxide blood levels, heart rate and rhythm, breathing rate and rhythm, the flow of air through your mouth and nose, snoring, body muscle movements, and chest and belly movement.   Follow-Up: At [facility], you and your health needs are our priority.  As part of our continuing mission to provide you with exceptional heart care, we have created designated Provider Care Teams.  These Care Teams include your primary Cardiologist (physician) and Advanced Practice Providers (APPs -  Physician Assistants and Nurse Practitioners) who all work together to provide you with the care you need, when you need it.  We recommend signing up for the patient portal called "MyChart".  Sign up information is provided on this After Visit Summary.  MyChart is used to connect with patients for Virtual Visits (Telemedicine).  Patients are able to view lab/test results, encounter notes, upcoming appointments, etc.  Non-urgent messages can be sent to your provider as well.   To learn more about what you can do with MyChart, go to [email or web address].    Your next appointment:   6 month(s)  Provider:   Reatha Harps, MD  or Marjie Skiff, PA-C  Other Instructions Please keep blood pressure log at home

## 2022-10-21 LAB — BASIC METABOLIC PANEL
BUN/Creatinine Ratio: 14 (ref 9–20)
BUN: 15 mg/dL (ref 6–24)
CO2: 21 mmol/L (ref 20–29)
Calcium: 9.7 mg/dL (ref 8.7–10.2)
Chloride: 102 mmol/L (ref 96–106)
Creatinine, Ser: 1.07 mg/dL (ref 0.76–1.27)
Glucose: 112 mg/dL — ABNORMAL HIGH (ref 70–99)
Potassium: 4.9 mmol/L (ref 3.5–5.2)
Sodium: 139 mmol/L (ref 134–144)
eGFR: 83 mL/min/{1.73_m2} (ref >59)

## 2022-11-01 ENCOUNTER — Telehealth: Payer: Self-pay | Admitting: Student

## 2022-11-01 NOTE — Telephone Encounter (Signed)
Patient is returning LPN's call for lab results. Please advise.

## 2022-11-02 NOTE — Telephone Encounter (Signed)
Spoke with patient relayed lab results. Verbalized understanding and no questions st this time.

## 2022-11-08 ENCOUNTER — Telehealth: Payer: Self-pay

## 2022-11-08 NOTE — Telephone Encounter (Signed)
**Note De-Identified Jorge Green Obfuscation** Per the [employer] Provider Portal: The following solutions for the service date entered do not require Pre-Authorization by [employer].  CPT Code: 95811-Split Night Sleep Study

## 2022-11-26 ENCOUNTER — Ambulatory Visit (HOSPITAL_BASED_OUTPATIENT_CLINIC_OR_DEPARTMENT_OTHER): Payer: BC Managed Care – PPO | Attending: Student | Admitting: Cardiology

## 2022-11-26 DIAGNOSIS — G4733 Obstructive sleep apnea (adult) (pediatric): Secondary | ICD-10-CM | POA: Diagnosis not present

## 2022-11-27 NOTE — Procedures (Signed)
       Patient Name: Jorge Green, Jorge Green Study Date:11/26/2022 Gender: Male D.O.B: 1970/07/09 Age (years): 52 Referring Provider: Marjie Skiff PA-C Height (inches): 71 Interpreting Physician: Armanda Magic MD, ABSM Weight (lbs): 237 RPSGT: Rolene Arbour BMI: 33 MRN: [id number] Neck Size: 18.00  CLINICAL INFORMATION Sleep Study Type: NPSG  Indication for sleep study: N/A  Epworth Sleepiness Score: 7  SLEEP STUDY TECHNIQUE As per the AASM Manual for the Scoring of Sleep and Associated Events v2.3 (April 2016) with a hypopnea requiring 4% desaturations.  The channels recorded and monitored were frontal, central and occipital EEG, electrooculogram (EOG), submentalis EMG (chin), nasal and oral airflow, thoracic and abdominal wall motion, anterior tibialis EMG, snore microphone, electrocardiogram, and pulse oximetry.  MEDICATIONS Medications self-administered by patient taken the night of the study : N/A  SLEEP ARCHITECTURE The study was initiated at 9:42:42 PM and ended at 3:47:22 AM.  Sleep onset time was 10.4 minutes and the sleep efficiency was 61.7%. The total sleep time was 225 minutes.  Stage REM latency was 163.5 minutes.  The patient spent 2.7% of the night in stage N1 sleep, 82.9% in stage N2 sleep, 0.0% in stage N3 and 14.4% in REM.  Alpha intrusion was absent.  Supine sleep was 50.67%.  RESPIRATORY PARAMETERS The overall apnea/hypopnea index (AHI) was 10.1 per hour. There were 15 total apneas, including 15 obstructive, 0 central and 0 mixed apneas. There were 23 hypopneas and 19 RERAs.  The AHI during Stage REM sleep was 3.7 per hour.  AHI while supine was 17.9 per hour.  The mean oxygen saturation was 95.5%. The minimum SpO2 during sleep was 90.0%.  moderate snoring was noted during this study.  CARDIAC DATA The 2 lead EKG demonstrated sinus rhythm. The mean heart rate was 75.7 beats per minute. Other EKG findings include: PVCs  LEG MOVEMENT DATA The  total PLMS were 0 with a resulting PLMS index of 0.0. Associated arousal with leg movement index was 0.5 .  IMPRESSIONS - Mild obstructive sleep apnea occurred during this study (AHI = 10.1/h). - The patient had minimal or no oxygen desaturation during the study (Min O2 = 90.0%) - The patient snored with moderate snoring volume. - PVCs were noted during this study. - Clinically significant periodic limb movements did not occur during sleep. No significant associated arousals.  DIAGNOSIS - Obstructive Sleep Apnea (G47.33)  RECOMMENDATIONS - Therapeutic CPAP titration to determine optimal pressure required to alleviate sleep disordered breathing. - Positional therapy avoiding supine position during sleep. - Avoid alcohol, sedatives and other CNS depressants that may worsen sleep apnea and disrupt normal sleep architecture. - Sleep hygiene should be reviewed to assess factors that may improve sleep quality. - Weight management and regular exercise should be initiated or continued if appropriate.  [Electronically signed] 11/27/2022 03:56 PM  Armanda Magic MD, ABSM Diplomate, American Board of Sleep Medicine

## 2022-11-27 NOTE — Progress Notes (Signed)
    Patient Name: Jorge Green, Jorge Green Study Date:11/26/2022 Gender: Male D.O.B: 1970/07/09 Age (years): 52 Referring Provider: Marjie Skiff PA-C Height (inches): 71 Interpreting Physician: Armanda Magic MD, ABSM Weight (lbs): 237 RPSGT: Rolene Arbour BMI: 33 MRN: [id number] Neck Size: 18.00  CLINICAL INFORMATION Sleep Study Type: NPSG  Indication for sleep study: N/A  Epworth Sleepiness Score: 7  SLEEP STUDY TECHNIQUE As per the AASM Manual for the Scoring of Sleep and Associated Events v2.3 (April 2016) with a hypopnea requiring 4% desaturations.  The channels recorded and monitored were frontal, central and occipital EEG, electrooculogram (EOG), submentalis EMG (chin), nasal and oral airflow, thoracic and abdominal wall motion, anterior tibialis EMG, snore microphone, electrocardiogram, and pulse oximetry.  MEDICATIONS Medications self-administered by patient taken the night of the study : N/A  SLEEP ARCHITECTURE The study was initiated at 9:42:42 PM and ended at 3:47:22 AM.  Sleep onset time was 10.4 minutes and the sleep efficiency was 61.7%. The total sleep time was 225 minutes.  Stage REM latency was 163.5 minutes.  The patient spent 2.7% of the night in stage N1 sleep, 82.9% in stage N2 sleep, 0.0% in stage N3 and 14.4% in REM.  Alpha intrusion was absent.  Supine sleep was 50.67%.  RESPIRATORY PARAMETERS The overall apnea/hypopnea index (AHI) was 10.1 per hour. There were 15 total apneas, including 15 obstructive, 0 central and 0 mixed apneas. There were 23 hypopneas and 19 RERAs.  The AHI during Stage REM sleep was 3.7 per hour.  AHI while supine was 17.9 per hour.  The mean oxygen saturation was 95.5%. The minimum SpO2 during sleep was 90.0%.  moderate snoring was noted during this study.  CARDIAC DATA The 2 lead EKG demonstrated sinus rhythm. The mean heart rate was 75.7 beats per minute. Other EKG findings include: PVCs  LEG MOVEMENT DATA The  total PLMS were 0 with a resulting PLMS index of 0.0. Associated arousal with leg movement index was 0.5 .  IMPRESSIONS - Mild obstructive sleep apnea occurred during this study (AHI = 10.1/h). - The patient had minimal or no oxygen desaturation during the study (Min O2 = 90.0%) - The patient snored with moderate snoring volume. - PVCs were noted during this study. - Clinically significant periodic limb movements did not occur during sleep. No significant associated arousals.  DIAGNOSIS - Obstructive Sleep Apnea (G47.33)  RECOMMENDATIONS - Therapeutic CPAP titration to determine optimal pressure required to alleviate sleep disordered breathing. - Positional therapy avoiding supine position during sleep. - Avoid alcohol, sedatives and other CNS depressants that may worsen sleep apnea and disrupt normal sleep architecture. - Sleep hygiene should be reviewed to assess factors that may improve sleep quality. - Weight management and regular exercise should be initiated or continued if appropriate.  [Electronically signed] 11/27/2022 03:56 PM  Armanda Magic MD, ABSM Diplomate, American Board of Sleep Medicine

## 2022-12-11 ENCOUNTER — Telehealth: Payer: Self-pay | Admitting: *Deleted

## 2022-12-11 DIAGNOSIS — I2510 Atherosclerotic heart disease of native coronary artery without angina pectoris: Secondary | ICD-10-CM

## 2022-12-11 DIAGNOSIS — G4733 Obstructive sleep apnea (adult) (pediatric): Secondary | ICD-10-CM

## 2022-12-11 DIAGNOSIS — I10 Essential (primary) hypertension: Secondary | ICD-10-CM

## 2022-12-11 NOTE — Telephone Encounter (Signed)
The patient has been notified of the result and verbalized understanding.  All questions (if any) were answered. Latrelle Dodrill, CMA 12/11/2022 3:10 PM    Will precert titration

## 2022-12-11 NOTE — Telephone Encounter (Signed)
-----   Message from Armanda Magic sent at 11/27/2022  3:57 PM EST ----- Please let patient know that they have sleep apnea.  Recommend therapeutic CPAP titration for treatment of patient's sleep disordered breathing.  If unable to perform an in lab titration then initiate ResMed auto CPAP from 4 to 15cm H2O with heated humidity and mask of choice and overnight pulse ox on CPAP.

## 2023-03-20 ENCOUNTER — Telehealth: Payer: Self-pay

## 2023-03-20 DIAGNOSIS — I2510 Atherosclerotic heart disease of native coronary artery without angina pectoris: Secondary | ICD-10-CM

## 2023-03-20 DIAGNOSIS — G4733 Obstructive sleep apnea (adult) (pediatric): Secondary | ICD-10-CM

## 2023-03-20 NOTE — Telephone Encounter (Signed)
**Note De-Identified Jorge Green Obfuscation** Per the [employer] website: [facility] does not require a prior authorization for CPT code 13086 (CPAP Titration).  I have transferred the CPAP Titration order to the sleep lab so they can contact the pt to schedule the test.

## 2023-04-09 ENCOUNTER — Ambulatory Visit (HOSPITAL_COMMUNITY)
Admission: RE | Admit: 2023-04-09 | Discharge: 2023-04-09 | Disposition: A | Discharge status: Home / Self Care | Source: Ambulatory Visit | Attending: Family Medicine | Admitting: Family Medicine

## 2023-04-09 DIAGNOSIS — Z87891 Personal history of nicotine dependence: Secondary | ICD-10-CM | POA: Insufficient documentation

## 2023-04-09 DIAGNOSIS — Z122 Encounter for screening for malignant neoplasm of respiratory organs: Secondary | ICD-10-CM | POA: Insufficient documentation

## 2023-04-09 DIAGNOSIS — F17210 Nicotine dependence, cigarettes, uncomplicated: Secondary | ICD-10-CM | POA: Diagnosis present

## 2023-04-23 ENCOUNTER — Emergency Department (HOSPITAL_COMMUNITY)
Admission: EM | Admit: 2023-04-23 | Discharge: 2023-04-23 | Discharge status: Against Medical Advice | Attending: Emergency Medicine | Admitting: Emergency Medicine

## 2023-04-23 ENCOUNTER — Emergency Department (HOSPITAL_COMMUNITY)

## 2023-04-23 ENCOUNTER — Encounter (HOSPITAL_COMMUNITY): Payer: Self-pay

## 2023-04-23 ENCOUNTER — Other Ambulatory Visit: Payer: Self-pay

## 2023-04-23 DIAGNOSIS — R0789 Other chest pain: Secondary | ICD-10-CM | POA: Diagnosis present

## 2023-04-23 DIAGNOSIS — Z5321 Procedure and treatment not carried out due to patient leaving prior to being seen by health care provider: Secondary | ICD-10-CM | POA: Diagnosis not present

## 2023-04-23 DIAGNOSIS — M79602 Pain in left arm: Secondary | ICD-10-CM | POA: Diagnosis not present

## 2023-04-23 LAB — BASIC METABOLIC PANEL WITH GFR
Anion gap: 9 (ref 5–15)
BUN: 12 mg/dL (ref 6–20)
CO2: 24 mmol/L (ref 22–32)
Calcium: 9.2 mg/dL (ref 8.9–10.3)
Chloride: 104 mmol/L (ref 98–111)
Creatinine, Ser: 0.95 mg/dL (ref 0.61–1.24)
GFR, Estimated: >60 mL/min (ref >60)
Glucose, Bld: 116 mg/dL — ABNORMAL HIGH (ref 70–99)
Potassium: 3.6 mmol/L (ref 3.5–5.1)
Sodium: 137 mmol/L (ref 135–145)

## 2023-04-23 LAB — CBC
HCT: 47.8 % (ref 39.0–52.0)
Hemoglobin: 15.8 g/dL (ref 13.0–17.0)
MCH: 27.5 pg (ref 26.0–34.0)
MCHC: 33.1 g/dL (ref 30.0–36.0)
MCV: 83.1 fL (ref 80.0–100.0)
Platelets: 272 10*3/uL (ref 150–400)
RBC: 5.75 MIL/uL (ref 4.22–5.81)
RDW: 13.2 % (ref 11.5–15.5)
WBC: 7.1 10*3/uL (ref 4.0–10.5)
nRBC: 0 % (ref 0.0–0.2)

## 2023-04-23 LAB — TROPONIN I (HIGH SENSITIVITY)
Troponin I (High Sensitivity): 20 ng/L — ABNORMAL HIGH (ref <18)
Troponin I (High Sensitivity): 58 ng/L — ABNORMAL HIGH (ref <18)

## 2023-04-23 NOTE — ED Provider Triage Note (Signed)
 Emergency Medicine Provider Triage Evaluation Note  Jorge Green , a 53 y.o. male  was evaluated in triage.  Pt complains of left sided chest pain. Reports he just got done working today, sat in his car and developed a left sided chest pain without radiation. No SOB. No nausea, vomiting. States pain 7/10. Described as pressure. Hx of MI in July of last year. No blood thinners.  Review of Systems  Positive:  Negative:   Physical Exam  BP (!) 175/117   Pulse 87   Temp 98 F (36.7 C) (Oral)   Resp 19   Ht 5\' 11"  (1.803 m)   Wt 107.5 kg   SpO2 96%   BMI 33.05 kg/m  Gen:   Awake, no distress   Resp:  Normal effort  MSK:   Moves extremities without difficulty  Other:    Medical Decision Making  Medically screening exam initiated at 3:17 PM.  Appropriate orders placed.  Jorge Green was informed that the remainder of the evaluation will be completed by another provider, this initial triage assessment does not replace that evaluation, and the importance of remaining in the ED until their evaluation is complete.     Jorge Aden, PA-C 04/23/23 1519

## 2023-04-23 NOTE — ED Notes (Signed)
 No resp 20+ mins for vitals - mutilple calls

## 2023-04-23 NOTE — ED Triage Notes (Signed)
 Reports had just got off work and sat in his car and started having symptoms like his last heart attack.  Complains of pain in left chest and left arm.  Described as pressure.  Denies any other symptoms.

## 2023-05-04 ENCOUNTER — Other Ambulatory Visit: Payer: Self-pay

## 2023-05-04 ENCOUNTER — Other Ambulatory Visit (HOSPITAL_COMMUNITY): Payer: Self-pay

## 2023-05-04 ENCOUNTER — Telehealth: Payer: Self-pay | Admitting: Cardiovascular Disease

## 2023-05-04 MED ORDER — HYDROCHLOROTHIAZIDE 25 MG PO TABS
25.0000 mg | ORAL_TABLET | Freq: Every day | ORAL | 3 refills | Status: DC
Start: 2023-05-04 — End: 2023-05-04

## 2023-05-04 MED ORDER — IRBESARTAN 150 MG PO TABS: 150.0000 mg | ORAL_TABLET | Freq: Every day | ORAL | 3 refills | Status: DC

## 2023-05-04 MED ORDER — SPIRONOLACTONE 25 MG PO TABS
25.0000 mg | ORAL_TABLET | Freq: Every day | ORAL | 3 refills | Status: DC
Start: 2023-05-04 — End: 2023-05-04

## 2023-05-04 MED ORDER — SPIRONOLACTONE 25 MG PO TABS
25.0000 mg | ORAL_TABLET | Freq: Every day | ORAL | 3 refills | Status: DC
  Filled 2023-05-04: qty 90, 90d supply, fill #0

## 2023-05-04 MED ORDER — CARVEDILOL 25 MG PO TABS: 25.0000 mg | ORAL_TABLET | Freq: Two times a day (BID) | ORAL | 3 refills | Status: DC

## 2023-05-04 MED ORDER — NIFEDIPINE ER 90 MG PO TB24: 90.0000 mg | ORAL_TABLET | Freq: Every day | ORAL | 3 refills | Status: DC

## 2023-05-04 MED ORDER — CARVEDILOL 25 MG PO TABS
25.0000 mg | ORAL_TABLET | Freq: Two times a day (BID) | ORAL | 3 refills | Status: DC
  Filled 2023-05-04: qty 180, 90d supply, fill #0

## 2023-05-04 MED ORDER — CLOPIDOGREL BISULFATE 75 MG PO TABS: 75.0000 mg | ORAL_TABLET | Freq: Every day | ORAL | 3 refills | Status: DC

## 2023-05-04 MED ORDER — NIFEDIPINE ER 90 MG PO TB24: 90.0000 mg | ORAL_TABLET | Freq: Every day | ORAL | 3 refills | Status: AC

## 2023-05-04 MED ORDER — CLOPIDOGREL BISULFATE 75 MG PO TABS
75.0000 mg | ORAL_TABLET | Freq: Every day | ORAL | 3 refills | Status: DC
Start: 2023-05-04 — End: 2023-05-04

## 2023-05-04 MED ORDER — NIFEDIPINE ER 90 MG PO TB24
90.0000 mg | ORAL_TABLET | Freq: Every day | ORAL | 3 refills | Status: DC
  Filled 2023-05-04: qty 90, 90d supply, fill #0

## 2023-05-04 MED ORDER — SPIRONOLACTONE 25 MG PO TABS: 25.0000 mg | ORAL_TABLET | Freq: Every day | ORAL | 3 refills | Status: DC

## 2023-05-04 MED ORDER — IRBESARTAN 150 MG PO TABS
150.0000 mg | ORAL_TABLET | Freq: Every day | ORAL | 3 refills | Status: DC
Start: 2023-05-04 — End: 2023-05-04

## 2023-05-04 MED ORDER — CLOPIDOGREL BISULFATE 75 MG PO TABS: 75.0000 mg | ORAL_TABLET | Freq: Every day | ORAL | 3 refills | Status: AC

## 2023-05-04 MED ORDER — HYDROCHLOROTHIAZIDE 25 MG PO TABS
25.0000 mg | ORAL_TABLET | Freq: Every day | ORAL | 3 refills | Status: DC
  Filled 2023-05-04: qty 90, 90d supply, fill #0

## 2023-05-04 MED ORDER — CARVEDILOL 25 MG PO TABS: 25.0000 mg | ORAL_TABLET | Freq: Two times a day (BID) | ORAL | 3 refills | Status: AC

## 2023-05-04 MED ORDER — HYDROCHLOROTHIAZIDE 25 MG PO TABS: 25.0000 mg | ORAL_TABLET | Freq: Every day | ORAL | 3 refills | Status: AC

## 2023-05-04 MED ORDER — IRBESARTAN 150 MG PO TABS
150.0000 mg | ORAL_TABLET | Freq: Every day | ORAL | 3 refills | Status: DC
  Filled 2023-05-04: qty 90, 90d supply, fill #0

## 2023-05-04 MED ORDER — CLOPIDOGREL BISULFATE 75 MG PO TABS
75.0000 mg | ORAL_TABLET | Freq: Every day | ORAL | 3 refills | Status: DC
  Filled 2023-05-04: qty 90, 90d supply, fill #0

## 2023-05-04 MED ORDER — SPIRONOLACTONE 25 MG PO TABS: 25.0000 mg | ORAL_TABLET | Freq: Every day | ORAL | 3 refills | Status: AC

## 2023-05-04 NOTE — Telephone Encounter (Signed)
 Spoke to [place] at [facility] they will be checking with Chyrel Craw NP about elevated B/P to see if she increased patient's B/P meds.She will call back.

## 2023-05-04 NOTE — Telephone Encounter (Signed)
 Pt c/o BP issue: STAT if pt c/o blurred vision, one-sided weakness or slurred speech.  STAT if BP is GREATER than 180/120 TODAY.  STAT if BP is LESS than 90/60 and SYMPTOMATIC TODAY  1. What is your BP concern? Too high   2. Have you taken any BP medication today?yes  3. What are your last 5 BP readings? 179/106  4. Are you having any other symptoms (ex. Dizziness, headache, blurred vision, passed out)? No   Brooke Hester,NP at [facility] called because pt was there in the office and wants us  aware of his BP

## 2023-05-04 NOTE — Telephone Encounter (Signed)
 Spoke to [place] at [facility] NP wanted Dr.O'Neal to adjust blood pressure medications.They stated patient has not been taking B/P medications.  Spoke to patient stated he has been out of B/P meds.Advised of importance of taking B/P meds.Stated he needs refills sent to [facility] on [address].Advised I will send refills.He will buy a omron B/P monitor and check B/P daily, he will bring readings along with list of  all medications to appointment scheduled with Palmer Bobo NP 5/8 at 2:45 pm.I will make Dr.O'Neal aware.

## 2023-05-14 ENCOUNTER — Other Ambulatory Visit: Payer: Self-pay | Admitting: Acute Care

## 2023-05-14 DIAGNOSIS — Z87891 Personal history of nicotine dependence: Secondary | ICD-10-CM

## 2023-05-14 DIAGNOSIS — Z122 Encounter for screening for malignant neoplasm of respiratory organs: Secondary | ICD-10-CM

## 2023-05-14 DIAGNOSIS — F17210 Nicotine dependence, cigarettes, uncomplicated: Secondary | ICD-10-CM

## 2023-05-14 NOTE — Addendum Note (Signed)
 Addended by: Joslyn Nim on: 05/14/2023 04:26 PM   Modules accepted: Orders

## 2023-05-14 NOTE — Telephone Encounter (Signed)
 Per Neomi Banks: Split night sleep study was recommended at last visit but looks like this was not ordered. Will order today. Ordered split night study because he has previously not had success with home studies.

## 2023-05-16 ENCOUNTER — Ambulatory Visit (HOSPITAL_BASED_OUTPATIENT_CLINIC_OR_DEPARTMENT_OTHER): Attending: Cardiology | Admitting: Cardiology

## 2023-05-16 VITALS — Ht 71.0 in | Wt 237.0 lb

## 2023-05-16 DIAGNOSIS — I10 Essential (primary) hypertension: Secondary | ICD-10-CM | POA: Diagnosis not present

## 2023-05-16 DIAGNOSIS — G4733 Obstructive sleep apnea (adult) (pediatric): Secondary | ICD-10-CM | POA: Insufficient documentation

## 2023-05-17 ENCOUNTER — Ambulatory Visit: Attending: Emergency Medicine | Admitting: Emergency Medicine

## 2023-05-17 ENCOUNTER — Encounter: Payer: Self-pay | Admitting: Emergency Medicine

## 2023-05-17 VITALS — BP 162/100 | HR 93 | Ht 71.0 in | Wt 236.2 lb

## 2023-05-17 DIAGNOSIS — G4733 Obstructive sleep apnea (adult) (pediatric): Secondary | ICD-10-CM | POA: Diagnosis not present

## 2023-05-17 DIAGNOSIS — I10 Essential (primary) hypertension: Secondary | ICD-10-CM | POA: Diagnosis not present

## 2023-05-17 DIAGNOSIS — I2510 Atherosclerotic heart disease of native coronary artery without angina pectoris: Secondary | ICD-10-CM | POA: Diagnosis not present

## 2023-05-17 DIAGNOSIS — Z720 Tobacco use: Secondary | ICD-10-CM

## 2023-05-17 DIAGNOSIS — E782 Mixed hyperlipidemia: Secondary | ICD-10-CM

## 2023-05-17 MED ORDER — IRBESARTAN 300 MG PO TABS: 300.0000 mg | ORAL_TABLET | Freq: Every day | ORAL | 2 refills | Status: DC

## 2023-05-17 NOTE — Patient Instructions (Signed)
 Medication Instructions:  INCREASE YOUR IRBESARTAN  TO 300 MG DAILY.  Lab Work: Nutritional therapist IN 2 WEEKS.   Testing/Procedures: NONE  Follow-Up: At [facility], you and your health needs are our priority.  As part of our continuing mission to provide you with exceptional heart care, our providers are all part of one team.  This team includes your primary Cardiologist (physician) and Advanced Practice Providers or APPs (Physician Assistants and Nurse Practitioners) who all work together to provide you with the care you need, when you need it.  Your next appointment:   3 MONTHS  Provider:   MADISON FOUNTAIN, DNP

## 2023-05-17 NOTE — Progress Notes (Signed)
 Cardiology Office Note:    Date:  05/17/2023  ID:  Jorge Green, DOB [birth year], MRN [id number] PCP: Chyrel Craw, NP  [facility] Providers Cardiologist:  Oneil Bigness, MD Cardiology APP:  Casimer Clear, PA-C       Patient Profile:      Chief Complaint: Acute visit for hypertension History of Present Illness:  Jorge Green is a 53 y.o. male with visit-pertinent history of coronary artery disease with recent NSTEMI in 06/2022 (medical therapy recommended given no PCI targets), hypertension, hyperlipidemia, type 2 diabetes, obstructive sleep apnea, tobacco abuse  Patient was admitted from 06/19/2018 for 2 06/21/2022 for NSTEMI after presenting with chest pain.  High sensitive troponin peaked at 3154.  LHC showed 30% stenosis of proximal to mid RCA, 100% stenosis of third RPL, 100% stenosis of OM1 and OM 3, and 80% stenosis of first lateral marginal vessel.  Anatomy consistent with diabetic vessels.  Medical therapy was recommended given no PCI targets.  Echocardiogram at that time showed LVEF of 55 to 60%, no RWMA, moderate LVH, grade 1 DD.  He was started on DAPT with aspirin  and Plavix  as well as Coreg  and Imdur .  PTA Lipitor was increased and PTA losartan  was switched to irbesartan  for more blood pressure control.  He was seen for follow-up he was doing well without recurrent chest pains.  His carvedilol  was increased to 25 mg twice daily for additional blood pressure control.  Sleep study was ordered at that time.  He was last seen in office on 10/20/2022.  He was doing well without cardiovascular complaints.  Imdur  was discontinued given headache.  His blood pressure was mildly elevated at 130/80.  He was to take blood pressure at home.  Patient called the nurse triage line on 05/04/2023 noting elevated blood pressures.  He was noted his blood pressure to be severely elevated however he not been taking his medications as he has been out.  Medications were sent for refill  and he was to follow-up in the office.   Discussed the use of AI scribe software for clinical note transcription with the patient, who gave verbal consent to proceed.  History of Present Illness Jorge Green is a 53 year old male with hypertension and diabetes who presents with uncontrolled blood pressure.  His blood pressure remains elevated despite adherence to multiple antihypertensive medications, including irbesartan , nifedipine , carvedilol , spironolactone , amlodipine , and hydrochlorothiazide . He previously discontinued Imdur  due to headaches.  He notes that he sometimes he gets overwhelmed and finds it challenging with having to take so many medications every day and use insulin  daily and sometimes he just feels overwhelmed and stops taking his medications for several days.  This is what happened about a month and a half ago after he ran out of refills and was not taking his medications leading to significantly elevated blood pressure.  About a month ago he followed up with his PCP who refilled all of his medications and he has been compliant over the past month.  However he knows his blood pressure has been persistently elevated.  He manages diabetes with insulin  injections. Family history includes diabetes in his mother, grandmother, sister, and brother.   He has history of sleep apnea and recently completed his third sleep study actually this morning.  He stopped using his CPAP in the past and his insurance company took CPAP back from him.  He knows his uncontrolled sleep apnea contributes to his hypertension.  He smokes seven cigarettes  daily, reduced from a pack a day. His diet includes greasy and spicy foods, which he acknowledges may affect blood pressure. He attempts to exercise by running, walking, and biking but struggles with weight management.  He denies chest pain, shortness of breath, lower extremity edema, fatigue, palpitations, melena, hematuria, hemoptysis, diaphoresis,  weakness, presyncope, syncope, orthopnea, and PND.  Review of systems:  Please see the history of present illness. All other systems are reviewed and otherwise negative.     Home Medications:    Current Meds  Medication Sig   amLODipine  (NORVASC ) 10 MG tablet Take 10 mg by mouth daily.   aspirin  EC 81 MG tablet Take 81 mg by mouth in the morning.   atorvastatin  (LIPITOR) 40 MG tablet Take 1 tablet (40 mg total) by mouth daily.   carvedilol  (COREG ) 25 MG tablet Take 1 tablet (25 mg total) by mouth 2 (two) times daily.   clopidogrel  (PLAVIX ) 75 MG tablet Take 1 tablet (75 mg total) by mouth daily.   empagliflozin  (JARDIANCE ) 10 MG TABS tablet Take 10 mg by mouth daily.   hydrochlorothiazide  (HYDRODIURIL ) 25 MG tablet Take 1 tablet (25 mg total) by mouth daily. For 90 days   icosapent  Ethyl (VASCEPA ) 1 g capsule Take 2 capsules (2 g total) by mouth 2 (two) times daily.   irbesartan  (AVAPRO ) 300 MG tablet Take 1 tablet (300 mg total) by mouth daily.   LANTUS SOLOSTAR 100 UNIT/ML Solostar Pen Inject 60 Units into the skin every evening.   metFORMIN  (GLUCOPHAGE ) 1000 MG tablet Take 1,000 mg by mouth 2 (two) times daily.   Multiple Vitamin (MULTIVITAMIN WITH MINERALS) TABS tablet Take 1 tablet by mouth daily.   NIFEdipine  (ADALAT  CC) 90 MG 24 hr tablet Take 1 tablet (90 mg total) by mouth daily.   spironolactone  (ALDACTONE ) 25 MG tablet Take 1 tablet (25 mg total) by mouth daily.   [DISCONTINUED] irbesartan  (AVAPRO ) 150 MG tablet Take 1 tablet (150 mg total) by mouth daily.         Studies Reviewed:  Echocardiogram 06/20/2022 1. Left ventricular ejection fraction, by estimation, is 55 to 60%. The  left ventricle has normal function. The left ventricle has no regional  wall motion abnormalities. There is moderate concentric left ventricular  hypertrophy. Left ventricular  diastolic parameters are consistent with Grade I diastolic dysfunction  (impaired relaxation).   2. Right ventricular  systolic function is normal. The right ventricular  size is normal. Tricuspid regurgitation signal is inadequate for assessing  PA pressure.   3. The mitral valve is grossly normal. Trivial mitral valve  regurgitation. No evidence of mitral stenosis.   4. The aortic valve is tricuspid. Aortic valve regurgitation is not  visualized. Aortic valve sclerosis is present, with no evidence of aortic  valve stenosis.   5. The inferior vena cava is normal in size with greater than 50%  respiratory variability, suggesting right atrial pressure of 3 mmHg.   Left heart catheterization 06/20/2022   Prox RCA to Mid RCA lesion is 30% stenosed.   3rd RPL lesion is 100% stenosed.   3rd Mrg lesion is 100% stenosed.   1st Mrg lesion is 100% stenosed.   Lat 1st Mrg lesion is 80% stenosed.   The left ventricular systolic function is normal.   LV end diastolic pressure is mildly elevated.   The left ventricular ejection fraction is 55-65% by visual estimate.  Diagnostic Dominance: Right  Risk Assessment/Calculations:     HYPERTENSION CONTROL Vitals:   05/17/23  1442 05/17/23 1512  BP: (!) 170/106 (!) 162/100    The patient's blood pressure is elevated above target today.  In order to address the patient's elevated BP: A new medication was prescribed today.          Physical Exam:   VS:  BP (!) 162/100 (BP Location: Right Arm, Patient Position: Sitting, Cuff Size: Large)   Pulse 93   Ht 5\' 11"  (1.803 m)   Wt 236 lb 3.2 oz (107.1 kg)   SpO2 99%   BMI 32.94 kg/m    Wt Readings from Last 3 Encounters:  05/17/23 236 lb 3.2 oz (107.1 kg)  05/16/23 237 lb (107.5 kg)  04/23/23 237 lb (107.5 kg)    GEN: Well nourished, well developed in no acute distress NECK: No JVD; No carotid bruits CARDIAC: RRR, no murmurs, rubs, gallops RESPIRATORY:  Clear to auscultation without rales, wheezing or rhonchi  ABDOMEN: Soft, non-tender, non-distended EXTREMITIES:  No edema; No acute deformity      Assessment and Plan:  Coronary artery disease S/p NSTEMI on 06/2022 with LHC showing 30% stenosis of proximal to mid RCA, 100% stenosis of third RPL, 100% stenosis of OM1 and OM 3, 80% stenosis of first lateral marginal vessel.  Anatomy consistent with diabetic vessels.  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 hypertension clinic for further evaluation - Patient will begin taking blood pressure daily and recording BP now - Will consider addition of doxazosin on follow-up visit if blood pressure remains uncontrolled  Obstructive sleep apnea He was previously diagnosed with obstructive sleep apnea.  However stopped CPAP use a little over a year ago and insurance company took back the CPAP machine.   - He completed his third sleep study this morning - Currently awaiting sleep study results and initiation of CPAP - Much education given on sleep apnea and effects on cardiovascular health and hypertension  Tobacco use Currently smoking 7 cigarettes a day.  Used to smoke around a pack a day and was down to 3 to 4 cigarettes a day not too long ago. - Complete cessation encouraged  Hyperlipidemia Lipoprotein (A) 55.5 on 06/2022 LDL 44, HDL 24, TG 240, TC 116 on 01/6107 LDL under excellent control and under goal less than 55 - He will be  due for repeat fasting lipid panel at follow-up visit - Continue atorvastatin  40 mg daily       Dispo:  Return in about 3 months (around 08/17/2023).  Signed, Ava Boatman, NP

## 2023-05-17 NOTE — Telephone Encounter (Signed)
**Note De-Identified [name] Obfuscation** Per the [employer]: PA Not Required for a Split Night Sleep Study (CPT Code: 09811) per [facility].  I have transferred the order to the sleep lab so they Can contact the pt to schedule the test.

## 2023-05-19 NOTE — Procedures (Signed)
   Maryan Smalling [facility] [address] [place], [place] [address] Tel: [phone]   Fax: [phone]  Titration Interpretation  Patient Name:  Jorge Green, Jorge Green Date:  05/16/2023 Referring Physician:  Dr. Gaylyn Keas  Indications for Polysomnography The patient is a 53 year old Male who is 5\' 11"  and weighs 237.0 lbs. His BMI equals 33.2.  A full night titration treatment study was performed.  Medication  No Data.   Polysomnogram Data A full night polysomnogram recorded the standard physiologic parameters including EEG, EOG, EMG, EKG, nasal and oral airflow.  Respiratory parameters of chest and abdominal movements were recorded with Respiratory Inductance Plethysmography belts.  Oxygen saturation was recorded by pulse oximetry.   Sleep Architecture The total recording time of the polysomnogram was 450.1 minutes.  The total sleep time was 424.5 minutes.  The patient spent 2.0% of total sleep time in Stage N1, 73.4% in Stage N2, 0.1% in Stages N3, and 24.5% in REM.  Sleep latency was 10.4 minutes.  REM latency was 45.5 minutes.  Sleep Efficiency was 94.3%.  Wake after Sleep Onset time was 15.0 minutes.  Titration Summary The patient was titrated at pressures ranging from 6 cm/H20 up to 18 cm/H20-.  The last pressure used in the study was 18 cm/H20  Respiratory Events The polysomnogram revealed a presence of 0 obstructive, 1 central, and 0 mixed apneas resulting in an Apnea index of 0.1 events per hour.  There were 33 hypopneas (>=3% desaturation and/or arousal) resulting in an Apnea\Hypopnea Index (AHI >=3% desaturation and/or arousal) of 4.8 events per hour.  There were 14 hypopneas (>=4% desaturation) resulting in an Apnea\Hypopnea Index (AHI >=4% desaturation) of 2.1 events per hour.  There were 38 Respiratory Effort Related Arousals resulting in a RERA index of 5.4 events per hour. The Respiratory Disturbance Index is 10.2 events per hour.  The snore  index was 176.5 events per hour.  Mean oxygen saturation was 96.3%.  The lowest oxygen saturation during sleep was 90.0%.  Time spent <=88% oxygen saturation was 0- minutes .  Limb Activity There were 0 limb movements recorded.    Cardiac Summary The average pulse rate was 77.4 bpm.  The minimum pulse rate was 68.0 bpm while the maximum pulse rate was 91.0 bpm.  Cardiac rhythm was NSR with PVCs.  Diagnosis:  Obstructive Sleep Apnea  Recommendations: Recommend a trial of ResMed CPAP therapy as 18cm H2O with heated humidity and Medium [employer] Simplus Mask. The patient should be counseled in good sleep hygiene and avoid sleeping in the supine position. Encourage patient to avoid driving when sleepy. Followup with Sleep Medicine Clinic in 6 weeks.   This study was personally reviewed and electronically signed by: Dr. Gaylyn Keas Accredited Board Certified in Sleep Medicine Date/Time: 05/19/2023 09:00PM

## 2023-05-21 ENCOUNTER — Telehealth: Payer: Self-pay

## 2023-05-21 NOTE — Telephone Encounter (Signed)
-----   Message from Gaylyn Keas sent at 05/19/2023  9:01 PM EDT ----- Please let patient know that they had a successful PAP titration and let DME know that orders are in EPIC.  Please set up 6 week OV with me.

## 2023-05-21 NOTE — Telephone Encounter (Signed)
 Notified patient via VM, per DPR, order for PAP device and supplies has been sent to [facility].

## 2023-09-04 ENCOUNTER — Ambulatory Visit: Admitting: Emergency Medicine

## 2023-09-26 ENCOUNTER — Encounter: Payer: Self-pay | Admitting: *Deleted

## 2023-09-28 ENCOUNTER — Encounter: Payer: Self-pay | Admitting: Emergency Medicine

## 2023-09-28 ENCOUNTER — Ambulatory Visit: Attending: Emergency Medicine | Admitting: Emergency Medicine

## 2023-09-28 VITALS — BP 158/92 | HR 77 | Ht 71.0 in | Wt 248.4 lb

## 2023-09-28 DIAGNOSIS — E782 Mixed hyperlipidemia: Secondary | ICD-10-CM

## 2023-09-28 DIAGNOSIS — G4733 Obstructive sleep apnea (adult) (pediatric): Secondary | ICD-10-CM

## 2023-09-28 DIAGNOSIS — Z720 Tobacco use: Secondary | ICD-10-CM | POA: Diagnosis not present

## 2023-09-28 DIAGNOSIS — I10 Essential (primary) hypertension: Secondary | ICD-10-CM

## 2023-09-28 DIAGNOSIS — E118 Type 2 diabetes mellitus with unspecified complications: Secondary | ICD-10-CM

## 2023-09-28 DIAGNOSIS — Z794 Long term (current) use of insulin: Secondary | ICD-10-CM

## 2023-09-28 DIAGNOSIS — I1A0 Resistant hypertension: Secondary | ICD-10-CM | POA: Diagnosis not present

## 2023-09-28 DIAGNOSIS — I2510 Atherosclerotic heart disease of native coronary artery without angina pectoris: Secondary | ICD-10-CM | POA: Diagnosis not present

## 2023-09-28 LAB — BASIC METABOLIC PANEL WITH GFR
BUN/Creatinine Ratio: 13 (ref 9–20)
BUN: 14 mg/dL (ref 6–24)
CO2: 22 mmol/L (ref 20–29)
Calcium: 9.9 mg/dL (ref 8.7–10.2)
Chloride: 102 mmol/L (ref 96–106)
Creatinine, Ser: 1.06 mg/dL (ref 0.76–1.27)
Glucose: 125 mg/dL — ABNORMAL HIGH (ref 70–99)
Potassium: 4.8 mmol/L (ref 3.5–5.2)
Sodium: 140 mmol/L (ref 134–144)
eGFR: 84 mL/min/1.73 (ref >59)

## 2023-09-28 MED ORDER — VALSARTAN 320 MG PO TABS: 320.0000 mg | ORAL_TABLET | Freq: Every day | ORAL | 3 refills | Status: AC

## 2023-09-28 NOTE — Progress Notes (Signed)
 Cardiology Office Note:    Date:  09/30/2023  ID:  Jorge Green, DOB 07/22/1970, MRN [id number] PCP: Cristopher Bottcher, NP  [facility] Providers Cardiologist:  Darryle ONEIDA Decent, MD Cardiology APP:  Rana Lum CROME, NP       Patient Profile:       Chief Complaint: 15-month follow-up History of Present Illness:  Jorge Green is a 53 y.o. male with visit-pertinent history of coronary artery disease with recent NSTEMI in 06/2022 (medical therapy recommended given no PCI targets), hypertension, hyperlipidemia, type 2 diabetes, obstructive sleep apnea, tobacco abuse   Patient was admitted from 06/19/2022 - 06/21/2022 for NSTEMI after presenting with chest pain.  High sensitive troponin peaked at 3154.  LHC showed 30% stenosis of proximal to mid RCA, 100% stenosis of third RPL, 100% stenosis of OM1 and OM 3, and 80% stenosis of first lateral marginal vessel.  Anatomy consistent with diabetic vessels.  Medical therapy was recommended given no PCI targets.  Echocardiogram at that time showed LVEF of 55 to 60%, no RWMA, moderate LVH, grade 1 DD.  He was started on DAPT with aspirin  and Plavix  as well as Coreg  and Imdur .  PTA Lipitor was increased and PTA losartan  was switched to irbesartan  for more blood pressure control.   He was seen for follow-up he was doing well without recurrent chest pains.  His carvedilol  was increased to 25 mg twice daily for additional blood pressure control.  Sleep study was ordered at that time.   He was last seen in office on 10/20/2022.  He was doing well without cardiovascular complaints.  Imdur  was discontinued given headache.  His blood pressure was mildly elevated at 130/80.  He was to take blood pressure at home.   Patient called the nurse triage line on 05/04/2023 noting elevated blood pressures.  He was noted his blood pressure to be severely elevated however he not been taking his medications as he has been out.  Medications were sent for refill and he  was to follow-up in the office.  He was last seen in office on 05/17/2023.  His blood pressure remains elevated despite adherence to multiple antihypertensive medications.  He has history of obstructive sleep apnea and is working on getting this treated with CPAP.  His irbesartan  was increased to 300 mg daily.  He was referred to [facility] but failed to follow-up.  He was smoking about 7 cigarettes a day.   Discussed the use of AI scribe software for clinical note transcription with the patient, who gave verbal consent to proceed.  History of Present Illness Jorge Green is a 53 year old male with hypertension and coronary artery disease who presents for follow-up.  Today he is without any acute cardiovascular concerns or complaints.  He experiences difficulty managing blood pressure due to inconsistent medication adherence. Current medications include spironolactone  25 mg daily, nifedipine  90 mg daily, hydrochlorothiazide  25 mg daily, carvedilol  25 mg twice daily, irbesartan  300 mg daily, and amlodipine  10 mg daily. Nifedipine  is taken separately to avoid headaches.  He recently self discontinued irbesartan  300 mg due to erectile dysfunction, which were not present at a lower dose of 150 mg. Medications are taken approximately four to five times a week, with frequent missed doses of carvedilol .  He smokes about seven cigarettes a day and uses a CPAP machine nightly for sleep apnea. 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 No chest pain  or shortness of breath is present.  Denies lightheadedness, dizziness, syncope, presyncope, palpitations, orthopnea, PND   Review of systems:  Please see the history of present illness. All other systems are reviewed and otherwise negative.      Studies Reviewed:    EKG Interpretation Date/Time:  Friday September 28 2023 07:49:37 EDT Ventricular Rate:  77 PR Interval:  168 QRS  Duration:  82 QT Interval:  378 QTC Calculation: 427 R Axis:   -46  Text Interpretation: Normal sinus rhythm Left anterior fascicular block Nonspecific T wave abnormality When compared with ECG of 23-Apr-2023 15:03, Nonspecific T wave abnormality no longer evident in Inferior leads Confirmed by Rana Dixon [phone]) on 09/28/2023 8:22:53 AM   Echocardiogram 06/20/2022 1. Left ventricular ejection fraction, by estimation, is 55 to 60%. The  left ventricle has normal function. The left ventricle has no regional  wall motion abnormalities. There is moderate concentric left ventricular  hypertrophy. Left ventricular  diastolic parameters are consistent with Grade I diastolic dysfunction  (impaired relaxation).   2. Right ventricular systolic function is normal. The right ventricular  size is normal. Tricuspid regurgitation signal is inadequate for assessing  PA pressure.   3. The mitral valve is grossly normal. Trivial mitral valve  regurgitation. No evidence of mitral stenosis.   4. The aortic valve is tricuspid. Aortic valve regurgitation is not  visualized. Aortic valve sclerosis is present, with no evidence of aortic  valve stenosis.   5. The inferior vena cava is normal in size with greater than 50%  respiratory variability, suggesting right atrial pressure of 3 mmHg.    Left heart catheterization 06/20/2022   Prox RCA to Mid RCA lesion is 30% stenosed.   3rd RPL lesion is 100% stenosed.   3rd Mrg lesion is 100% stenosed.   1st Mrg lesion is 100% stenosed.   Lat 1st Mrg lesion is 80% stenosed.   The left ventricular systolic function is normal.   LV end diastolic pressure is mildly elevated.   The left ventricular ejection fraction is 55-65% by visual estimate.   Diagnostic Dominance: Right   Risk Assessment/Calculations:             Physical Exam:   VS:  BP (!) 158/92 (BP Location: Left Arm, Patient Position: Sitting, Cuff Size: Normal)   Pulse 77   Ht 5' 11 (1.803  m)   Wt 248 lb 6.4 oz (112.7 kg)   SpO2 98%   BMI 34.64 kg/m    Wt Readings from Last 3 Encounters:  09/28/23 248 lb 6.4 oz (112.7 kg)  05/17/23 236 lb 3.2 oz (107.1 kg)  05/16/23 237 lb (107.5 kg)    GEN: Well nourished, well developed in no acute distress NECK: No JVD; No carotid bruits CARDIAC: RRR, no murmurs, rubs, gallops RESPIRATORY:  Clear to auscultation without rales, wheezing or rhonchi  ABDOMEN: Soft, non-tender, non-distended EXTREMITIES:  No edema; No acute deformity     Assessment and Plan:  Coronary artery disease S/p NSTEMI on 06/2022 with LHC showing 30% stenosis of proximal to mid RCA, 100% stenosis of third RPL, 100% stenosis of OM1 and OM 3, 80% stenosis of first lateral marginal vessel.  Anatomy consistent with diabetic vessels.  Medical therapy was recommended given no PCI targets - Stable with no anginal symptoms, no indication for further ischemic evaluation at this time - Has completed > 12 months of DAPT however has stayed fairly noncompliant with medication regimen.  Will discuss with primary cardiologist at follow-up  visit on starting aspirin  monotherapy - Continue aspirin  81 mg daily and clopidogrel  75 mg daily - Continue carvedilol  25 mg twice daily - Continue atorvastatin  40 mg daily   Hypertension History of resistant hypertension that has been difficult to control Blood pressure today is 164/100 and repeat 158/92 He is not taking his blood pressure at home - His current medications today include: carvedilol  25 mg twice daily, hydrochlorothiazide  25 mg daily, irbesartan  300 mg daily, nifedipine  90 mg daily, spironolactone  25 mg daily, amlodipine  10 mg daily  - He remains fairly nonadherent to medication regimen, currently taking medications around 4-5 times a week and frequently missing afternoon carvedilol  dosing. He recently self discontinued irbesartan  due to erectile dysfunction.  Which did not occur at 150 mg dose - Plan to discontinue irbesartan   at this time and start valsartan  320 mg daily - Plan to refer to advanced hypertension clinic - Plan for renal artery duplex given his resistant HTN - Medication adherence strongly encouraged   Obstructive sleep apnea - Remains adherent to CPAP therapy   Tobacco use Currently smoking around 7 cigarettes a day - Complete cessation encouraged   Hyperlipidemia, LDL goal <55 Lipoprotein (A) 55.5 on 06/2022 LDL 124 on 07/2023, 89 on 01/2023, and 93 on 10/2022 TG 107 on 07/2023 He has had a steady rise in his LDL cholesterol over the past several months given nonadherence to cholesterol-lowering therapy - Will repeat fasting lipid panel when he remains adherent to medication regimen for at least 8 weeks, for now continue atorvastatin  at current dose - Continue atorvastatin  40 mg daily and Vascepa  2 g twice daily - Medication adherence strongly encouraged  T2DM A1c 7.7% on 07/2023 - Management per PCP      Dispo:  Return in about 4 months (around 01/28/2024).  Signed, Lum LITTIE Louis, NP

## 2023-09-28 NOTE — Patient Instructions (Addendum)
 Medication Instructions:  STOP TAKING IRBESARTAN . START TAKING VALSARTAN  320 MG DAILY.  Lab Work: BMET TO BE DONE TODAY.   Testing/Procedures: Your physician has requested that you have a renal artery duplex. During this test, an ultrasound is used to evaluate blood flow to the kidneys. Allow one hour for this exam. Do not eat after midnight the day before and avoid carbonated beverages. Take your medications as you usually do.   Follow-Up: At [facility], you and your health needs are our priority.  As part of our continuing mission to provide you with exceptional heart care, our providers are all part of one team.  This team includes your primary Cardiologist (physician) and Advanced Practice Providers or APPs (Physician Assistants and Nurse Practitioners) who all work together to provide you with the care you need, when you need it.  Your next appointment:   4 MONTHS  REFERRAL HAS BEEN PUT IN TO [facility]. SOMEONE FROM OUR [facility] OFFICE WILL CONTACT YOU TO SCHEDULE AN APPOINTMENT.   Provider:   MADISON FOUNTAIN, NP

## 2023-09-30 ENCOUNTER — Encounter: Payer: Self-pay | Admitting: Emergency Medicine

## 2023-09-30 ENCOUNTER — Ambulatory Visit: Payer: Self-pay | Admitting: Emergency Medicine

## 2023-10-10 ENCOUNTER — Ambulatory Visit (HOSPITAL_COMMUNITY)
Admission: RE | Admit: 2023-10-10 | Discharge: 2023-10-10 | Disposition: A | Discharge status: Home / Self Care | Source: Ambulatory Visit | Attending: Emergency Medicine | Admitting: Emergency Medicine

## 2023-10-10 DIAGNOSIS — I1A0 Resistant hypertension: Secondary | ICD-10-CM | POA: Diagnosis present

## 2023-11-01 ENCOUNTER — Ambulatory Visit: Admitting: Cardiology

## 2023-11-22 ENCOUNTER — Telehealth: Payer: Self-pay

## 2023-11-30 ENCOUNTER — Ambulatory Visit: Admitting: Cardiology

## 2023-12-20 ENCOUNTER — Institutional Professional Consult (permissible substitution) (HOSPITAL_BASED_OUTPATIENT_CLINIC_OR_DEPARTMENT_OTHER): Admitting: Family

## 2023-12-28 ENCOUNTER — Encounter: Payer: Self-pay | Admitting: Emergency Medicine

## 2024-03-03 ENCOUNTER — Institutional Professional Consult (permissible substitution) (HOSPITAL_BASED_OUTPATIENT_CLINIC_OR_DEPARTMENT_OTHER): Admitting: Family

## 2024-03-14 ENCOUNTER — Ambulatory Visit: Admitting: Cardiology

## 2024-03-15 IMAGING — CT CT CHEST LUNG CANCER SCREENING LOW DOSE W/O CM
1 series · 10 of 10 positions shown, 13 images · non-contrast
Comparison: 04/29/2017 chest radiograph.

CLINICAL DATA: Thirty-five pack-year smoking history/current smoker



[ct lung segmentation data · axial · 0.76mm/px · z∈[-348,-348]mm · 10 of 325 frames shown]
[frame 1/325  mediastinal]
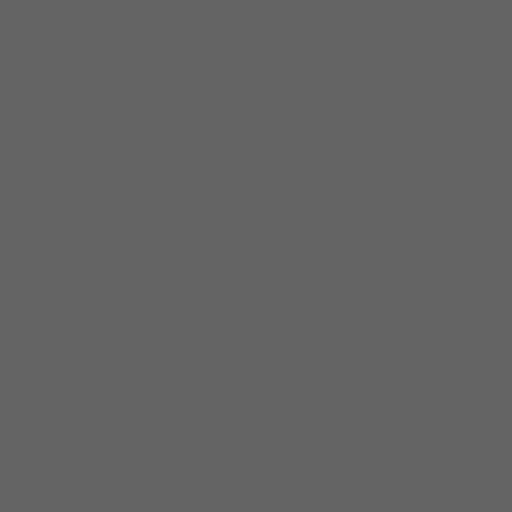
[frame 1/325  lung]
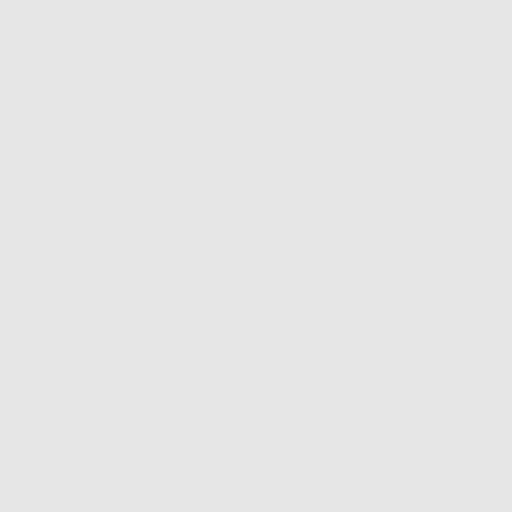
[frame 37/325  lung]
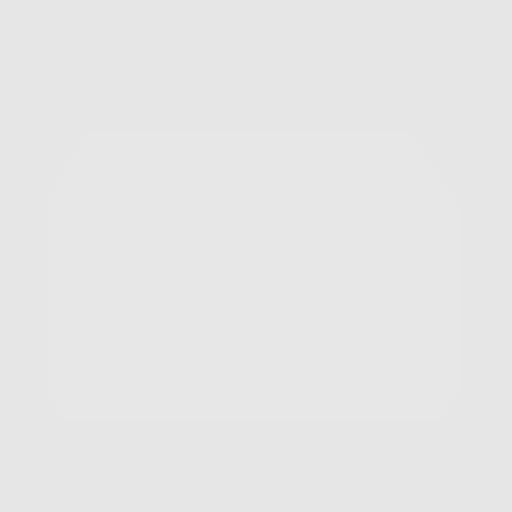
[frame 73/325  lung]
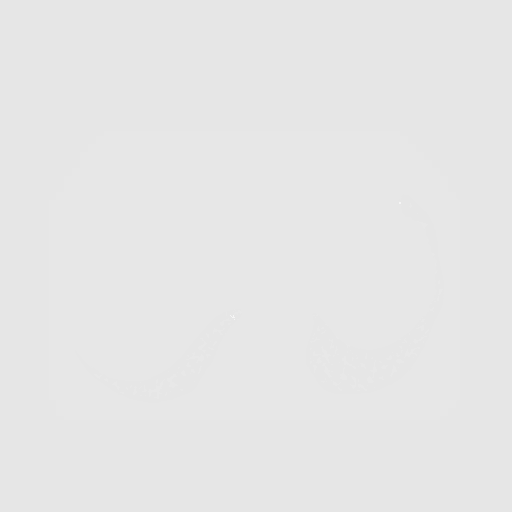
[frame 109/325  lung]
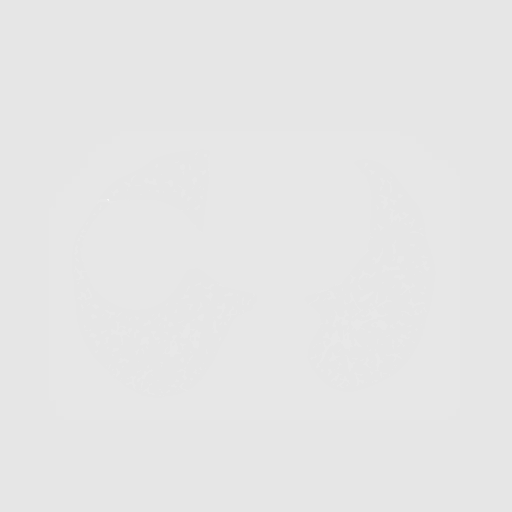
[frame 145/325  mediastinal]
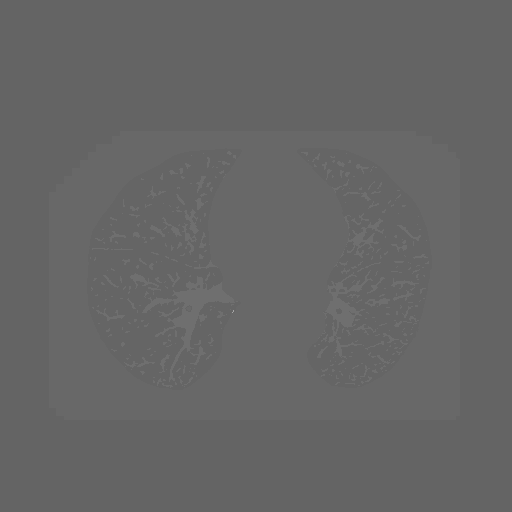
[frame 145/325  lung]
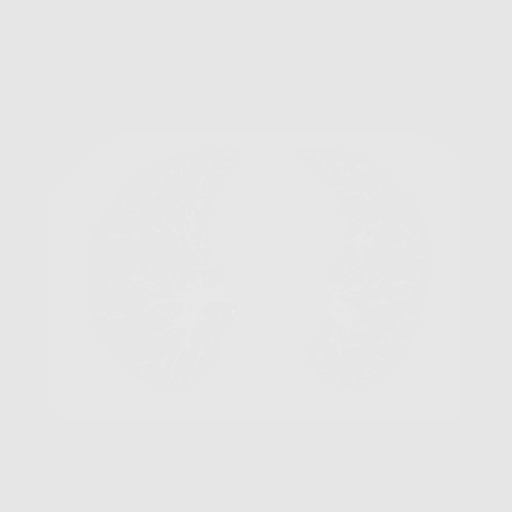
[frame 181/325  lung]
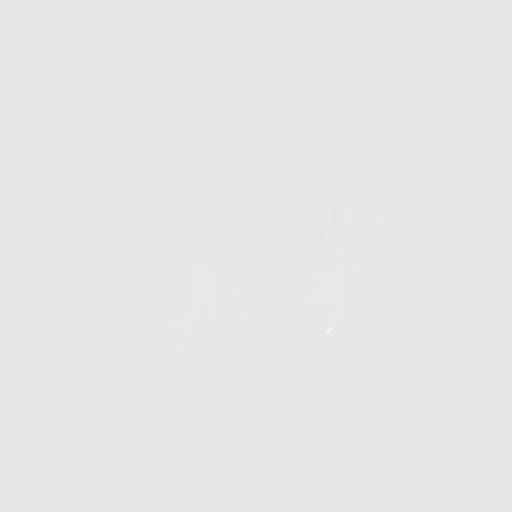
[frame 217/325  lung]
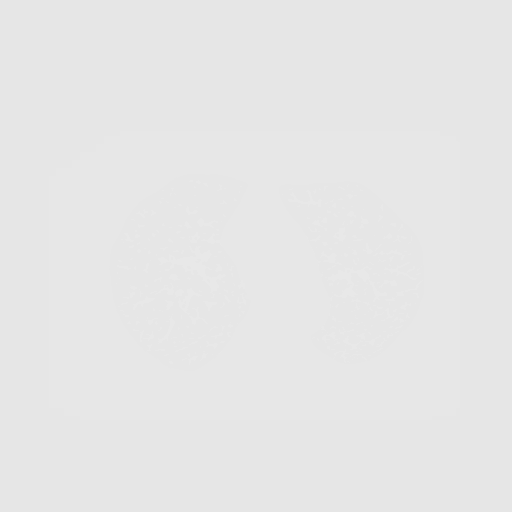
[frame 253/325  lung]
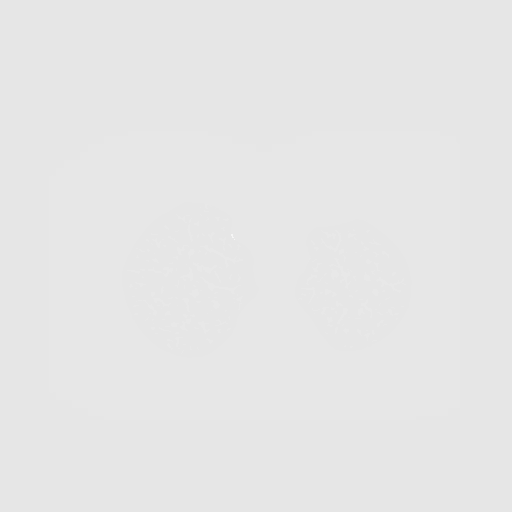
[frame 289/325  mediastinal]
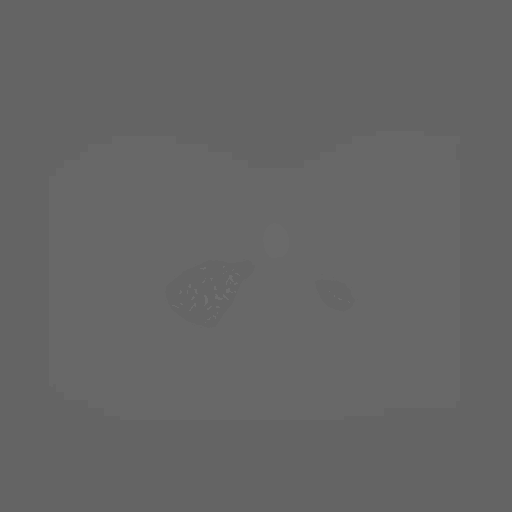
[frame 289/325  lung]
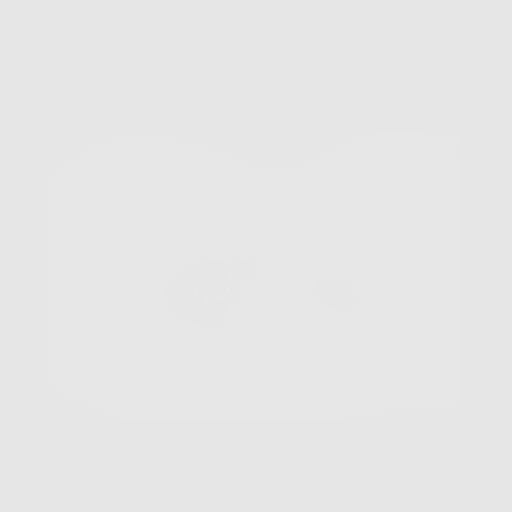
[frame 325/325  lung]
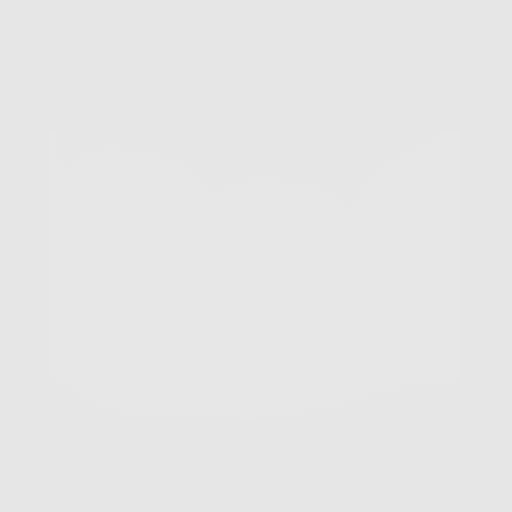

[10 of 10 positions shown; findings below may reference images not displayed]

FINDINGS: Cardiovascular: Aortic atherosclerosis. Tortuous thoracic aorta.
Borderline cardiomegaly with trace anterior pericardial fluid.
Likely physiologic. Three vessel coronary artery calcification.

Mediastinum/Nodes: No mediastinal or definite hilar adenopathy,
given limitations of unenhanced CT.

Lungs/Pleura: No pleural fluid. Mild centrilobular emphysema. Tiny
pulmonary nodules including at up to volume derived equivalent
diameter 5.1 mm in the right apex.

Upper Abdomen: Normal imaged portions of the liver, spleen, stomach,
adrenal glands, kidneys, gallbladder.

Musculoskeletal: No acute osseous abnormality.
IMPRESSION: 1. Lung-RADS 2, benign appearance or behavior. Continue annual
screening with low-dose chest CT without contrast in 12 months.
2. Aortic Atherosclerosis (8298V-8Z2.2) and Emphysema ([id number]).
3. Age advanced coronary artery atherosclerosis. Recommend
assessment of coronary risk factors and consideration of medical
therapy.
